# Patient Record
Sex: Male | Born: 1965 | Race: Black or African American | Hispanic: No | Marital: Single | Smoking: Former smoker
Health system: Southern US, Community
[De-identification: ages and names within clinical notes are randomized; demographics above are authoritative.]

## PROBLEM LIST (undated history)

## (undated) ENCOUNTER — Emergency Department (HOSPITAL_COMMUNITY): Payer: 59

## (undated) DIAGNOSIS — E119 Type 2 diabetes mellitus without complications: Secondary | ICD-10-CM

## (undated) DIAGNOSIS — E785 Hyperlipidemia, unspecified: Secondary | ICD-10-CM

## (undated) DIAGNOSIS — L02215 Cutaneous abscess of perineum: Secondary | ICD-10-CM

## (undated) DIAGNOSIS — Z720 Tobacco use: Secondary | ICD-10-CM

## (undated) DIAGNOSIS — E079 Disorder of thyroid, unspecified: Secondary | ICD-10-CM

## (undated) DIAGNOSIS — M1990 Unspecified osteoarthritis, unspecified site: Secondary | ICD-10-CM

## (undated) HISTORY — DX: Hyperlipidemia, unspecified: E78.5

## (undated) HISTORY — DX: Cutaneous abscess of perineum: L02.215

## (undated) HISTORY — DX: Unspecified osteoarthritis, unspecified site: M19.90

---

## 1999-07-02 ENCOUNTER — Emergency Department (HOSPITAL_COMMUNITY): Admission: EM | Admit: 1999-07-02 | Discharge: 1999-07-02 | Payer: Self-pay | Admitting: Emergency Medicine

## 1999-07-06 ENCOUNTER — Emergency Department (HOSPITAL_COMMUNITY): Admission: EM | Admit: 1999-07-06 | Discharge: 1999-07-06 | Payer: Self-pay

## 1999-09-02 ENCOUNTER — Encounter: Payer: Self-pay | Admitting: Emergency Medicine

## 1999-09-02 ENCOUNTER — Emergency Department (HOSPITAL_COMMUNITY): Admission: EM | Admit: 1999-09-02 | Discharge: 1999-09-02 | Payer: Self-pay | Admitting: Emergency Medicine

## 2001-03-04 ENCOUNTER — Emergency Department (HOSPITAL_COMMUNITY): Admission: EM | Admit: 2001-03-04 | Discharge: 2001-03-05 | Payer: Self-pay | Admitting: Emergency Medicine

## 2001-07-30 ENCOUNTER — Encounter: Payer: Self-pay | Admitting: Emergency Medicine

## 2001-07-30 ENCOUNTER — Emergency Department (HOSPITAL_COMMUNITY): Admission: EM | Admit: 2001-07-30 | Discharge: 2001-07-30 | Payer: Self-pay | Admitting: Emergency Medicine

## 2005-12-01 ENCOUNTER — Emergency Department (HOSPITAL_COMMUNITY): Admission: EM | Admit: 2005-12-01 | Discharge: 2005-12-01 | Payer: Self-pay | Admitting: Emergency Medicine

## 2012-07-03 ENCOUNTER — Emergency Department (HOSPITAL_COMMUNITY): Payer: Self-pay

## 2012-07-03 ENCOUNTER — Emergency Department (HOSPITAL_COMMUNITY)
Admission: EM | Admit: 2012-07-03 | Discharge: 2012-07-03 | Disposition: A | Discharge status: Home / Self Care | Payer: Self-pay | Attending: Emergency Medicine | Admitting: Emergency Medicine

## 2012-07-03 ENCOUNTER — Encounter (HOSPITAL_COMMUNITY): Payer: Self-pay | Admitting: Emergency Medicine

## 2012-07-03 DIAGNOSIS — R51 Headache: Secondary | ICD-10-CM | POA: Insufficient documentation

## 2012-07-03 DIAGNOSIS — R630 Anorexia: Secondary | ICD-10-CM | POA: Insufficient documentation

## 2012-07-03 DIAGNOSIS — J3489 Other specified disorders of nose and nasal sinuses: Secondary | ICD-10-CM | POA: Insufficient documentation

## 2012-07-03 DIAGNOSIS — B9789 Other viral agents as the cause of diseases classified elsewhere: Secondary | ICD-10-CM | POA: Insufficient documentation

## 2012-07-03 DIAGNOSIS — R52 Pain, unspecified: Secondary | ICD-10-CM | POA: Insufficient documentation

## 2012-07-03 DIAGNOSIS — R509 Fever, unspecified: Secondary | ICD-10-CM | POA: Insufficient documentation

## 2012-07-03 DIAGNOSIS — B349 Viral infection, unspecified: Secondary | ICD-10-CM

## 2012-07-03 DIAGNOSIS — R0602 Shortness of breath: Secondary | ICD-10-CM | POA: Insufficient documentation

## 2012-07-03 DIAGNOSIS — R6883 Chills (without fever): Secondary | ICD-10-CM | POA: Insufficient documentation

## 2012-07-03 DIAGNOSIS — R197 Diarrhea, unspecified: Secondary | ICD-10-CM | POA: Insufficient documentation

## 2012-07-03 DIAGNOSIS — R42 Dizziness and giddiness: Secondary | ICD-10-CM | POA: Insufficient documentation

## 2012-07-03 DIAGNOSIS — J00 Acute nasopharyngitis [common cold]: Secondary | ICD-10-CM | POA: Insufficient documentation

## 2012-07-03 DIAGNOSIS — R0989 Other specified symptoms and signs involving the circulatory and respiratory systems: Secondary | ICD-10-CM | POA: Insufficient documentation

## 2012-07-03 DIAGNOSIS — F17200 Nicotine dependence, unspecified, uncomplicated: Secondary | ICD-10-CM | POA: Insufficient documentation

## 2012-07-03 MED ORDER — BENZONATATE 100 MG PO CAPS: 100.0000 mg | ORAL_CAPSULE | Freq: Three times a day (TID) | ORAL | Status: DC

## 2012-07-03 MED ORDER — KETOROLAC TROMETHAMINE 30 MG/ML IJ SOLN
30.0000 mg | Freq: Once | INTRAMUSCULAR | Status: AC
  Administered 2012-07-03: 30 mg via INTRAVENOUS
  Filled 2012-07-03: qty 1

## 2012-07-03 MED ORDER — SODIUM CHLORIDE 0.9 % IV BOLUS (SEPSIS)
1000.0000 mL | Freq: Once | INTRAVENOUS | Status: AC
  Administered 2012-07-03: 1000 mL via INTRAVENOUS

## 2012-07-03 MED ORDER — PROMETHAZINE HCL 25 MG PO TABS: 25.0000 mg | ORAL_TABLET | Freq: Four times a day (QID) | ORAL | Status: DC | PRN

## 2012-07-03 MED ORDER — ONDANSETRON HCL 4 MG/2ML IJ SOLN
4.0000 mg | Freq: Once | INTRAMUSCULAR | Status: AC
  Administered 2012-07-03: 4 mg via INTRAVENOUS
  Filled 2012-07-03: qty 2

## 2012-07-03 NOTE — ED Provider Notes (Signed)
History     CSN: [id number]  Arrival date & time 07/03/12  1344   First MD Initiated Contact with Patient 07/03/12 1504      Chief Complaint  Patient presents with  . Emesis  . Generalized Body Aches    (Consider location/radiation/quality/duration/timing/severity/associated sxs/prior treatment) HPI Comments: Patient presents with a 4 to five-day history of vomiting, diarrhea and cold symptoms. He has some runny nose and chest congestion. He has a cough productive of yellow sputum. He had some subjective fevers at home. He feels achy all over. He's been taking over-the-counter medicines without relief. He states his symptoms have been worsening over last few days. He complains of some intermittent shortness of breath with exertion.  Patient is a 47 y.o. male presenting with vomiting.  Emesis  Associated symptoms include chills, cough, diarrhea, a fever, headaches and myalgias. Pertinent negatives include no abdominal pain and no arthralgias.    History reviewed. No pertinent past medical history.  History reviewed. No pertinent past surgical history.  No family history on file.  History  Substance Use Topics  . Smoking status: Current Every Day Smoker -- 1.0 packs/day    Types: Cigarettes  . Smokeless tobacco: Not on file  . Alcohol Use: Yes     Comment: occassional      Review of Systems  Constitutional: Positive for fever, chills, appetite change and fatigue. Negative for diaphoresis.  HENT: Positive for congestion and rhinorrhea.   Eyes: Negative.   Respiratory: Positive for cough and shortness of breath. Negative for chest tightness.   Cardiovascular: Negative for chest pain and leg swelling.  Gastrointestinal: Positive for nausea, vomiting and diarrhea. Negative for abdominal pain and blood in stool.  Genitourinary: Negative for frequency, hematuria, flank pain and difficulty urinating.  Musculoskeletal: Positive for myalgias. Negative for back pain and  arthralgias.  Skin: Negative for rash.  Neurological: Positive for light-headedness and headaches. Negative for dizziness, speech difficulty, weakness and numbness.    Allergies  Bee venom  Home Medications   Current Outpatient Rx  Name  Route  Sig  Dispense  Refill  . BENZONATATE 100 MG PO CAPS   Oral   Take 1 capsule (100 mg total) by mouth every 8 (eight) hours.   21 capsule   0   . PROMETHAZINE HCL 25 MG PO TABS   Oral   Take 1 tablet (25 mg total) by mouth every 6 (six) hours as needed for nausea.   30 tablet   0     BP 122/79  Pulse 118  Temp 99.5 F (37.5 C) (Oral)  Resp 19  SpO2 97%  Physical Exam  Constitutional: He is oriented to person, place, and time. He appears well-developed and well-nourished.  HENT:  Head: Normocephalic and atraumatic.  Mouth/Throat: Oropharynx is clear and moist.  Eyes: Pupils are equal, round, and reactive to light.  Neck: Normal range of motion. Neck supple.  Cardiovascular: Normal rate, regular rhythm and normal heart sounds.   Pulmonary/Chest: Effort normal and breath sounds normal. No respiratory distress. He has no wheezes. He has no rales. He exhibits no tenderness.  Abdominal: Soft. Bowel sounds are normal. There is no tenderness. There is no rebound and no guarding.  Musculoskeletal: Normal range of motion. He exhibits no edema.  Lymphadenopathy:    He has no cervical adenopathy.  Neurological: He is alert and oriented to person, place, and time.  Skin: Skin is warm and dry. No rash noted.  Psychiatric: He has a normal  mood and affect.    ED Course  Procedures (including critical care time)  No results found for this or any previous visit. Dg Chest 2 View  07/03/2012  *RADIOLOGY REPORT*  Clinical Data: Cough and fever  CHEST - 2 VIEW  Comparison: 12/21/2005  Findings: Normal heart size.  No pleural effusion identified.  Chronic interstitial coarsening is noted bilaterally.  There is chronic appearing airway thickening  as well.  No focal airspace consolidation.  The visualized osseous structures appear unremarkable.  IMPRESSION:  1.  Chronic bronchitic changes. 2.  No pneumonia.   Original Report Authenticated By: Signa Kell, M.D.      1. Viral syndrome       MDM  Patient was given IV fluids and Zofran. As well as Toradol. He states he is feeling much better after that. He's had no further episodes of vomiting. He has no evidence of pneumonia. He was discharged home in good condition was given prescriptions for Phenergan and cough medicine. Was advised to follow with primary care physician or return here if his symptoms worsen or are not improving.        Rolan Bucco, MD 07/03/12 1736

## 2012-07-03 NOTE — ED Notes (Signed)
States that he has been having vomiting diarrhea and body aches for the past week. Unable to eat.

## 2012-07-03 NOTE — Progress Notes (Signed)
Pt listed with no insurance coverage CM and [facility] liaison spoke with pt.  Pt offered services to assist with finding a [place] self pay provider & health reform information 

## 2012-07-03 NOTE — ED Notes (Signed)
Pt presents to ed with c/o generalized body pain, fever, cough n/v for a week now. Pt sts received flu shot this year; pt sts nobody else at home with similar symptoms.

## 2014-06-29 DIAGNOSIS — E0500 Thyrotoxicosis with diffuse goiter without thyrotoxic crisis or storm: Secondary | ICD-10-CM

## 2014-06-29 HISTORY — DX: Thyrotoxicosis with diffuse goiter without thyrotoxic crisis or storm: E05.00

## 2017-01-28 ENCOUNTER — Emergency Department (HOSPITAL_COMMUNITY): Payer: Self-pay

## 2017-01-28 ENCOUNTER — Encounter (HOSPITAL_COMMUNITY): Payer: Self-pay | Admitting: Emergency Medicine

## 2017-01-28 ENCOUNTER — Emergency Department (HOSPITAL_COMMUNITY)
Admission: EM | Admit: 2017-01-28 | Discharge: 2017-01-28 | Disposition: A | Discharge status: Home / Self Care | Payer: Self-pay | Attending: Emergency Medicine | Admitting: Emergency Medicine

## 2017-01-28 DIAGNOSIS — R1030 Lower abdominal pain, unspecified: Secondary | ICD-10-CM

## 2017-01-28 DIAGNOSIS — R5383 Other fatigue: Secondary | ICD-10-CM | POA: Insufficient documentation

## 2017-01-28 DIAGNOSIS — E119 Type 2 diabetes mellitus without complications: Secondary | ICD-10-CM | POA: Insufficient documentation

## 2017-01-28 DIAGNOSIS — F17210 Nicotine dependence, cigarettes, uncomplicated: Secondary | ICD-10-CM | POA: Insufficient documentation

## 2017-01-28 DIAGNOSIS — R197 Diarrhea, unspecified: Secondary | ICD-10-CM | POA: Insufficient documentation

## 2017-01-28 DIAGNOSIS — Z79899 Other long term (current) drug therapy: Secondary | ICD-10-CM | POA: Insufficient documentation

## 2017-01-28 DIAGNOSIS — R1031 Right lower quadrant pain: Secondary | ICD-10-CM | POA: Insufficient documentation

## 2017-01-28 DIAGNOSIS — R1032 Left lower quadrant pain: Secondary | ICD-10-CM | POA: Insufficient documentation

## 2017-01-28 DIAGNOSIS — R42 Dizziness and giddiness: Secondary | ICD-10-CM | POA: Insufficient documentation

## 2017-01-28 HISTORY — DX: Disorder of thyroid, unspecified: E07.9

## 2017-01-28 HISTORY — DX: Type 2 diabetes mellitus without complications: E11.9

## 2017-01-28 LAB — CBC
HCT: 44.9 % (ref 39.0–52.0)
Hemoglobin: 15.3 g/dL (ref 13.0–17.0)
MCH: 27.9 pg (ref 26.0–34.0)
MCHC: 34.1 g/dL (ref 30.0–36.0)
MCV: 81.8 fL (ref 78.0–100.0)
PLATELETS: 243 10*3/uL (ref 150–400)
RBC: 5.49 MIL/uL (ref 4.22–5.81)
RDW: 12.4 % (ref 11.5–15.5)
WBC: 6.6 10*3/uL (ref 4.0–10.5)

## 2017-01-28 LAB — URINALYSIS, ROUTINE W REFLEX MICROSCOPIC
Bacteria, UA: NONE SEEN
Bilirubin Urine: NEGATIVE
HGB URINE DIPSTICK: NEGATIVE
KETONES UR: NEGATIVE mg/dL
LEUKOCYTES UA: NEGATIVE
Nitrite: NEGATIVE
PH: 6 (ref 5.0–8.0)
PROTEIN: NEGATIVE mg/dL
Specific Gravity, Urine: 1.021 (ref 1.005–1.030)

## 2017-01-28 LAB — LIPASE, BLOOD: LIPASE: 37 U/L (ref 11–51)

## 2017-01-28 LAB — COMPREHENSIVE METABOLIC PANEL
ALK PHOS: 51 U/L (ref 38–126)
ALT: 20 U/L (ref 17–63)
AST: 21 U/L (ref 15–41)
Albumin: 3.9 g/dL (ref 3.5–5.0)
Anion gap: 7 (ref 5–15)
BILIRUBIN TOTAL: 0.3 mg/dL (ref 0.3–1.2)
BUN: 11 mg/dL (ref 6–20)
CALCIUM: 8.8 mg/dL — AB (ref 8.9–10.3)
CO2: 26 mmol/L (ref 22–32)
CREATININE: 1.13 mg/dL (ref 0.61–1.24)
Chloride: 105 mmol/L (ref 101–111)
GFR calc Af Amer: >60 mL/min (ref >60)
Glucose, Bld: 235 mg/dL — ABNORMAL HIGH (ref 65–99)
Potassium: 4.1 mmol/L (ref 3.5–5.1)
Sodium: 138 mmol/L (ref 135–145)
TOTAL PROTEIN: 7.2 g/dL (ref 6.5–8.1)

## 2017-01-28 LAB — TROPONIN I: Troponin I: <0.03 ng/mL (ref <0.03)

## 2017-01-28 MED ORDER — IOPAMIDOL (ISOVUE-300) INJECTION 61%
INTRAVENOUS | Status: AC
  Filled 2017-01-28: qty 100

## 2017-01-28 MED ORDER — IOPAMIDOL (ISOVUE-300) INJECTION 61%
100.0000 mL | Freq: Once | INTRAVENOUS | Status: AC | PRN
  Administered 2017-01-28: 100 mL via INTRAVENOUS

## 2017-01-28 MED ORDER — SODIUM CHLORIDE 0.9 % IV BOLUS (SEPSIS)
1000.0000 mL | Freq: Once | INTRAVENOUS | Status: AC
  Administered 2017-01-28: 1000 mL via INTRAVENOUS

## 2017-01-28 NOTE — ED Triage Notes (Signed)
Patient c/o abd pain and diarrhea that started yesterday. Patient also reports lightheadedness that started today.

## 2017-01-28 NOTE — ED Notes (Signed)
Patient transported to CT 

## 2017-01-28 NOTE — ED Provider Notes (Signed)
[facility]-EMERGENCY DEPT Provider Note   CSN: [id number] Arrival date & time: 01/28/17  0845     History   Chief Complaint Chief Complaint  Patient presents with  . Abdominal Pain  . Diarrhea  . Dizziness    HPI Kevin Oneill is a 51 y.o. male.  HPI  51 year old male with a history of diabetes presents with abdominal pain. He states his bilateral lower abdomen has been hurting since this morning. He had 4 episodes of loose watery stools yesterday without blood. However no diarrhea today. No nausea and vomiting either day. Had no abdominal pain yesterday but then has been having abdominal pain consistently today. It feels sore and occasionally crampy. He denies any back pain, chest pain. He feels overall fatigued and when he first woke up he was lightheaded. He did not eat today but has been drinking fluids. No fevers. No urinary symptoms. Pain is currently moderate. He declines pain medicine at this time.  Past Medical History:  Diagnosis Date  . Diabetes mellitus without complication (HCC)   . Thyroid disease     There are no active problems to display for this patient.   History reviewed. No pertinent surgical history.     Home Medications    Prior to Admission medications   Medication Sig Start Date End Date Taking? Authorizing Provider  levothyroxine (SYNTHROID, LEVOTHROID) 112 MCG tablet Take 112 mcg by mouth daily before breakfast.   Yes [provider]  metFORMIN (GLUCOPHAGE) 1000 MG tablet Take 1,000 mg by mouth 2 (two) times daily with a meal.   Yes [provider]  benzonatate (TESSALON) 100 MG capsule Take 1 capsule (100 mg total) by mouth every 8 (eight) hours. Patient not taking: Reported on 01/28/2017 07/03/12   Rolan Bucco, MD  promethazine (PHENERGAN) 25 MG tablet Take 1 tablet (25 mg total) by mouth every 6 (six) hours as needed for nausea. Patient not taking: Reported on 01/28/2017 07/03/12   Rolan Bucco, MD    Family History No  family history on file.  Social History Social History  Substance Use Topics  . Smoking status: Current Every Day Smoker    Packs/day: 1.00    Types: Cigarettes  . Smokeless tobacco: Never Used  . Alcohol use Yes     Comment: occassional     Allergies   Bee venom   Review of Systems Review of Systems  Constitutional: Positive for fatigue. Negative for fever.  Cardiovascular: Negative for chest pain.  Gastrointestinal: Positive for abdominal pain and diarrhea. Negative for blood in stool, nausea and vomiting.  Musculoskeletal: Negative for back pain.  Neurological: Positive for light-headedness.  All other systems reviewed and are negative.    Physical Exam Updated Vital Signs BP 116/77 (BP Location: Left Arm)   Pulse 76   Temp 98.3 F (36.8 C) (Oral)   Resp 15   Ht 5\' 9"  (1.753 m)   Wt 99.8 kg (220 lb)   SpO2 100%   BMI 32.49 kg/m   Physical Exam  Constitutional: He is oriented to person, place, and time. He appears well-developed and well-nourished.  HENT:  Head: Normocephalic and atraumatic.  Right Ear: External ear normal.  Left Ear: External ear normal.  Nose: Nose normal.  Eyes: Right eye exhibits no discharge. Left eye exhibits no discharge.  Neck: Neck supple.  Cardiovascular: Normal rate, regular rhythm and normal heart sounds.   Pulmonary/Chest: Effort normal and breath sounds normal.  Abdominal: Soft. There is tenderness in the right lower  quadrant and left lower quadrant.  LLQ worse than RLQ  Musculoskeletal: He exhibits no edema.  Neurological: He is alert and oriented to person, place, and time.  Skin: Skin is warm and dry.  Nursing note and vitals reviewed.    ED Treatments / Results  Labs (all labs ordered are listed, but only abnormal results are displayed) Labs Reviewed  COMPREHENSIVE METABOLIC PANEL - Abnormal; Notable for the following:       Result Value   Glucose, Bld 235 (*)    Calcium 8.8 (*)    All other components within  normal limits  URINALYSIS, ROUTINE W REFLEX MICROSCOPIC - Abnormal; Notable for the following:    Glucose, UA >=500 (*)    Squamous Epithelial / LPF 0-5 (*)    All other components within normal limits  LIPASE, BLOOD  CBC  TROPONIN I    EKG  EKG Interpretation  Date/Time:  Monday January 28 2017 14:01:24 EDT Ventricular Rate:  83 PR Interval:    QRS Duration: 79 QT Interval:  344 QTC Calculation: 405 R Axis:   73 Text Interpretation:  Sinus rhythm no significant change compared to earlier in the day Confirmed by Pricilla LovelessGoldston, Nikki Glanzer [phone]([id number]) on 01/28/2017 2:03:38 PM Also confirmed by Pricilla LovelessGoldston, Swara Donze [phone]), editor Madalyn RobEverhart, Marilyn [phone])  on 01/28/2017 2:05:00 PM       Radiology Ct Abdomen Pelvis W Contrast  Result Date: 01/28/2017 CLINICAL DATA:  51 year old male with abdominal pain and diarrhea since yesterday. EXAM: CT ABDOMEN AND PELVIS WITH CONTRAST TECHNIQUE: Multidetector CT imaging of the abdomen and pelvis was performed using the standard protocol following bolus administration of intravenous contrast. CONTRAST:  100mL ISOVUE-300 IOPAMIDOL (ISOVUE-300) INJECTION 61% COMPARISON:  Chest radiographs 07/03/2012 FINDINGS: Lower chest: Minor lung base atelectasis. Otherwise negative. No pericardial or pleural effusion. Hepatobiliary: Negative liver and gallbladder. No biliary ductal enlargement. Pancreas: Negative. Spleen: Negative. Adrenals/Urinary Tract: Normal adrenal glands. Bilateral renal enhancement and contrast excretion is normal. No nephrolithiasis. No perinephric stranding. Negative ureters. Unremarkable urinary bladder. Stomach/Bowel: Negative rectum. Mildly redundant sigmoid colon. Retained stool throughout the left colon, transverse, and right colon. But no areas of large bowel inflammation. Normal appendix (coronal image 58) located adjacent to the distal ileum. Negative terminal ileum. No dilated or inflamed small bowel. Negative stomach and duodenum. No abdominal free fluid.  Vascular/Lymphatic: Major arterial structures are patent. Minimal calcified plaque. Portal venous system appears patent. No lymphadenopathy. Reproductive: Negative. Other: No pelvic free fluid. Musculoskeletal: No acute osseous abnormality identified. IMPRESSION: No acute or inflammatory process identified in the abdomen or pelvis. Normal appendix. Electronically Signed   By: Odessa FlemingH  Hall M.D.   On: 01/28/2017 13:01    Procedures Procedures (including critical care time)  Medications Ordered in ED Medications  iopamidol (ISOVUE-300) 61 % injection (not administered)  sodium chloride 0.9 % bolus 1,000 mL (0 mLs Intravenous Stopped 01/28/17 1306)  iopamidol (ISOVUE-300) 61 % injection 100 mL (100 mLs Intravenous Contrast Given 01/28/17 1233)     Initial Impression / Assessment and Plan / ED Course  I have reviewed the triage vital signs and the nursing notes.  Pertinent labs & imaging results that were available during my care of the patient were reviewed by me and considered in my medical decision making (see chart for details).     No clear etiology for his abdominal pain on CT scan. Possibly this is related to the recent diarrhea although he does not currently have diarrhea. Vital signs are unremarkable. He feels better after IV  fluids. His labs are unremarkable except for moderate hyperglycemia. ECG appears mildly changed compared to several years ago but no acute ischemic changes. I doubt this is ACS. Tylenol, NSAIDs as needed, plenty of fluids. Follow-up with PCP or return if symptoms are worsening/not improving.  Final Clinical Impressions(s) / ED Diagnoses   Final diagnoses:  Lower abdominal pain    New Prescriptions Discharge Medication List as of 01/28/2017  2:04 PM       Pricilla LovelessGoldston, Frank Novelo, MD 01/28/17 1536

## 2018-11-16 ENCOUNTER — Encounter (HOSPITAL_COMMUNITY): Payer: Self-pay | Admitting: Emergency Medicine

## 2018-11-16 ENCOUNTER — Other Ambulatory Visit: Payer: Self-pay

## 2018-11-16 DIAGNOSIS — Z5321 Procedure and treatment not carried out due to patient leaving prior to being seen by health care provider: Secondary | ICD-10-CM | POA: Insufficient documentation

## 2018-11-16 DIAGNOSIS — R51 Headache: Secondary | ICD-10-CM | POA: Insufficient documentation

## 2018-11-16 NOTE — ED Triage Notes (Addendum)
Patient is complaining of a headache that he has had for about 5 days. Patient states he has a hx of ear infection.

## 2018-11-17 ENCOUNTER — Emergency Department (HOSPITAL_COMMUNITY)
Admission: EM | Admit: 2018-11-17 | Discharge: 2018-11-17 | Disposition: A | Discharge status: Home / Self Care | Payer: Self-pay | Attending: Emergency Medicine | Admitting: Emergency Medicine

## 2018-11-17 NOTE — ED Notes (Signed)
Pt states he is not wanting to wait any longer, Pt is stating he is going to [place], that they can call his Pcp from there.

## 2019-07-13 ENCOUNTER — Encounter (HOSPITAL_COMMUNITY): Payer: Self-pay

## 2019-07-13 ENCOUNTER — Other Ambulatory Visit: Payer: Self-pay

## 2019-07-13 DIAGNOSIS — Y9389 Activity, other specified: Secondary | ICD-10-CM | POA: Insufficient documentation

## 2019-07-13 DIAGNOSIS — E119 Type 2 diabetes mellitus without complications: Secondary | ICD-10-CM | POA: Insufficient documentation

## 2019-07-13 DIAGNOSIS — Y92810 Car as the place of occurrence of the external cause: Secondary | ICD-10-CM | POA: Insufficient documentation

## 2019-07-13 DIAGNOSIS — Y999 Unspecified external cause status: Secondary | ICD-10-CM | POA: Insufficient documentation

## 2019-07-13 DIAGNOSIS — F17210 Nicotine dependence, cigarettes, uncomplicated: Secondary | ICD-10-CM | POA: Insufficient documentation

## 2019-07-13 DIAGNOSIS — W010XXA Fall on same level from slipping, tripping and stumbling without subsequent striking against object, initial encounter: Secondary | ICD-10-CM | POA: Insufficient documentation

## 2019-07-13 DIAGNOSIS — Z79899 Other long term (current) drug therapy: Secondary | ICD-10-CM | POA: Insufficient documentation

## 2019-07-13 DIAGNOSIS — S0012XA Contusion of left eyelid and periocular area, initial encounter: Secondary | ICD-10-CM | POA: Insufficient documentation

## 2019-07-13 MED ORDER — TETRACAINE HCL 0.5 % OP SOLN
2.0000 [drp] | Freq: Once | OPHTHALMIC | Status: AC
  Administered 2019-07-14: 2 [drp] via OPHTHALMIC
  Filled 2019-07-13: qty 4

## 2019-07-13 MED ORDER — FLUORESCEIN SODIUM 1 MG OP STRP
1.0000 | ORAL_STRIP | Freq: Once | OPHTHALMIC | Status: AC
  Administered 2019-07-14: 1 via OPHTHALMIC
  Filled 2019-07-13: qty 1

## 2019-07-13 NOTE — ED Triage Notes (Signed)
Patient arrived stating that he fell on Saturday getting out of the car and hit his left eye. Reports his eye being sore and swelling is noted around the eye and redness in the eye. Patient declines any change in vision. No LOC.

## 2019-07-14 ENCOUNTER — Emergency Department (HOSPITAL_COMMUNITY)
Admission: EM | Admit: 2019-07-14 | Discharge: 2019-07-14 | Disposition: A | Discharge status: Home / Self Care | Payer: Self-pay | Attending: Emergency Medicine | Admitting: Emergency Medicine

## 2019-07-14 ENCOUNTER — Emergency Department (HOSPITAL_COMMUNITY): Payer: Self-pay

## 2019-07-14 DIAGNOSIS — S0083XA Contusion of other part of head, initial encounter: Secondary | ICD-10-CM

## 2019-07-14 MED ORDER — OFLOXACIN 0.3 % OP SOLN
1.0000 [drp] | Freq: Four times a day (QID) | OPHTHALMIC | Status: DC
  Administered 2019-07-14: 07:00:00 1 [drp] via OPHTHALMIC
  Filled 2019-07-14: qty 5
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
## 2020-02-26 DIAGNOSIS — T148XXA Other injury of unspecified body region, initial encounter: Secondary | ICD-10-CM

## 2020-02-26 HISTORY — DX: Other injury of unspecified body region, initial encounter: T14.8XXA

## 2020-02-27 ENCOUNTER — Other Ambulatory Visit: Payer: Self-pay

## 2020-02-27 ENCOUNTER — Encounter (HOSPITAL_COMMUNITY): Payer: Self-pay | Admitting: Emergency Medicine

## 2020-02-27 ENCOUNTER — Emergency Department (HOSPITAL_COMMUNITY): Payer: Self-pay

## 2020-02-27 ENCOUNTER — Emergency Department (HOSPITAL_COMMUNITY)
Admission: EM | Admit: 2020-02-27 | Discharge: 2020-02-27 | Disposition: A | Discharge status: Home / Self Care | Payer: Self-pay | Attending: Emergency Medicine | Admitting: Emergency Medicine

## 2020-02-27 DIAGNOSIS — F17210 Nicotine dependence, cigarettes, uncomplicated: Secondary | ICD-10-CM | POA: Insufficient documentation

## 2020-02-27 DIAGNOSIS — Z7984 Long term (current) use of oral hypoglycemic drugs: Secondary | ICD-10-CM | POA: Insufficient documentation

## 2020-02-27 DIAGNOSIS — Y92410 Unspecified street and highway as the place of occurrence of the external cause: Secondary | ICD-10-CM | POA: Insufficient documentation

## 2020-02-27 DIAGNOSIS — M25561 Pain in right knee: Secondary | ICD-10-CM | POA: Insufficient documentation

## 2020-02-27 DIAGNOSIS — E119 Type 2 diabetes mellitus without complications: Secondary | ICD-10-CM | POA: Insufficient documentation

## 2020-02-27 DIAGNOSIS — Y939 Activity, unspecified: Secondary | ICD-10-CM | POA: Insufficient documentation

## 2020-02-27 DIAGNOSIS — Y999 Unspecified external cause status: Secondary | ICD-10-CM | POA: Insufficient documentation

## 2020-02-27 MED ORDER — HYDROCODONE-ACETAMINOPHEN 5-325 MG PO TABS
2.0000 | ORAL_TABLET | Freq: Once | ORAL | Status: AC
  Administered 2020-02-27: 2 via ORAL
  Filled 2020-02-27: qty 2

## 2020-02-27 MED ORDER — CYCLOBENZAPRINE HCL 10 MG PO TABS: 10.0000 mg | ORAL_TABLET | Freq: Two times a day (BID) | ORAL | 0 refills | Status: DC | PRN

## 2020-02-27 MED ORDER — IBUPROFEN 800 MG PO TABS: 800.0000 mg | ORAL_TABLET | Freq: Three times a day (TID) | ORAL | 0 refills | Status: DC
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
## 2020-03-01 DIAGNOSIS — M25561 Pain in right knee: Secondary | ICD-10-CM | POA: Insufficient documentation

## 2020-03-02 ENCOUNTER — Other Ambulatory Visit: Payer: Self-pay | Admitting: Orthopedic Surgery

## 2020-03-02 DIAGNOSIS — M25561 Pain in right knee: Secondary | ICD-10-CM

## 2020-03-20 ENCOUNTER — Encounter (HOSPITAL_COMMUNITY): Payer: Self-pay

## 2020-03-20 ENCOUNTER — Other Ambulatory Visit: Payer: Self-pay

## 2020-03-20 ENCOUNTER — Emergency Department (HOSPITAL_COMMUNITY): Payer: Self-pay

## 2020-03-20 ENCOUNTER — Inpatient Hospital Stay (HOSPITAL_COMMUNITY)
Admission: EM | Admit: 2020-03-20 | Discharge: 2020-03-23 | DRG: 571 | Disposition: A | Discharge status: Home Health | Payer: Self-pay | Attending: Internal Medicine | Admitting: Internal Medicine

## 2020-03-20 DIAGNOSIS — Z8249 Family history of ischemic heart disease and other diseases of the circulatory system: Secondary | ICD-10-CM

## 2020-03-20 DIAGNOSIS — E039 Hypothyroidism, unspecified: Secondary | ICD-10-CM

## 2020-03-20 DIAGNOSIS — E876 Hypokalemia: Secondary | ICD-10-CM

## 2020-03-20 DIAGNOSIS — Z20822 Contact with and (suspected) exposure to covid-19: Secondary | ICD-10-CM | POA: Diagnosis present

## 2020-03-20 DIAGNOSIS — R739 Hyperglycemia, unspecified: Secondary | ICD-10-CM

## 2020-03-20 DIAGNOSIS — E871 Hypo-osmolality and hyponatremia: Secondary | ICD-10-CM | POA: Diagnosis present

## 2020-03-20 DIAGNOSIS — I10 Essential (primary) hypertension: Secondary | ICD-10-CM | POA: Diagnosis present

## 2020-03-20 DIAGNOSIS — R072 Precordial pain: Secondary | ICD-10-CM

## 2020-03-20 DIAGNOSIS — T383X6A Underdosing of insulin and oral hypoglycemic [antidiabetic] drugs, initial encounter: Secondary | ICD-10-CM | POA: Diagnosis present

## 2020-03-20 DIAGNOSIS — Z87891 Personal history of nicotine dependence: Secondary | ICD-10-CM

## 2020-03-20 DIAGNOSIS — L02215 Cutaneous abscess of perineum: Secondary | ICD-10-CM

## 2020-03-20 DIAGNOSIS — R079 Chest pain, unspecified: Secondary | ICD-10-CM

## 2020-03-20 DIAGNOSIS — L03314 Cellulitis of groin: Secondary | ICD-10-CM | POA: Diagnosis present

## 2020-03-20 DIAGNOSIS — R3911 Hesitancy of micturition: Secondary | ICD-10-CM | POA: Diagnosis present

## 2020-03-20 DIAGNOSIS — E1165 Type 2 diabetes mellitus with hyperglycemia: Secondary | ICD-10-CM | POA: Diagnosis present

## 2020-03-20 DIAGNOSIS — R0789 Other chest pain: Secondary | ICD-10-CM | POA: Diagnosis present

## 2020-03-20 DIAGNOSIS — Z794 Long term (current) use of insulin: Secondary | ICD-10-CM

## 2020-03-20 DIAGNOSIS — E785 Hyperlipidemia, unspecified: Secondary | ICD-10-CM | POA: Diagnosis present

## 2020-03-20 DIAGNOSIS — Z833 Family history of diabetes mellitus: Secondary | ICD-10-CM

## 2020-03-20 DIAGNOSIS — L02214 Cutaneous abscess of groin: Principal | ICD-10-CM

## 2020-03-20 DIAGNOSIS — E1169 Type 2 diabetes mellitus with other specified complication: Secondary | ICD-10-CM

## 2020-03-20 DIAGNOSIS — Z91120 Patient's intentional underdosing of medication regimen due to financial hardship: Secondary | ICD-10-CM

## 2020-03-20 DIAGNOSIS — F17210 Nicotine dependence, cigarettes, uncomplicated: Secondary | ICD-10-CM | POA: Diagnosis present

## 2020-03-20 HISTORY — DX: Tobacco use: Z72.0

## 2020-03-20 LAB — URINALYSIS, ROUTINE W REFLEX MICROSCOPIC
Bacteria, UA: NONE SEEN
Bilirubin Urine: NEGATIVE
Glucose, UA: >=500 mg/dL — AB
Ketones, ur: 20 mg/dL — AB
Leukocytes,Ua: NEGATIVE
Nitrite: NEGATIVE
Protein, ur: 100 mg/dL — AB
Specific Gravity, Urine: 1.035 — ABNORMAL HIGH (ref 1.005–1.030)
pH: 5 (ref 5.0–8.0)

## 2020-03-20 LAB — CBG MONITORING, ED: Glucose-Capillary: 349 mg/dL — ABNORMAL HIGH (ref 70–99)

## 2020-03-20 LAB — COMPREHENSIVE METABOLIC PANEL
ALT: 34 U/L (ref 0–44)
AST: 23 U/L (ref 15–41)
Albumin: 3.6 g/dL (ref 3.5–5.0)
Alkaline Phosphatase: 96 U/L (ref 38–126)
Anion gap: 12 (ref 5–15)
BUN: 14 mg/dL (ref 6–20)
CO2: 25 mmol/L (ref 22–32)
Calcium: 8.7 mg/dL — ABNORMAL LOW (ref 8.9–10.3)
Chloride: 97 mmol/L — ABNORMAL LOW (ref 98–111)
Creatinine, Ser: 1.24 mg/dL (ref 0.61–1.24)
GFR calc Af Amer: >60 mL/min (ref >60)
GFR calc non Af Amer: >60 mL/min (ref >60)
Glucose, Bld: 392 mg/dL — ABNORMAL HIGH (ref 70–99)
Potassium: 3.5 mmol/L (ref 3.5–5.1)
Sodium: 134 mmol/L — ABNORMAL LOW (ref 135–145)
Total Bilirubin: 1.2 mg/dL (ref 0.3–1.2)
Total Protein: 8 g/dL (ref 6.5–8.1)

## 2020-03-20 LAB — CBC
HCT: 41.4 % (ref 39.0–52.0)
Hemoglobin: 13.9 g/dL (ref 13.0–17.0)
MCH: 28.9 pg (ref 26.0–34.0)
MCHC: 33.6 g/dL (ref 30.0–36.0)
MCV: 86.1 fL (ref 80.0–100.0)
Platelets: 257 10*3/uL (ref 150–400)
RBC: 4.81 MIL/uL (ref 4.22–5.81)
RDW: 11.3 % — ABNORMAL LOW (ref 11.5–15.5)
WBC: 12.9 10*3/uL — ABNORMAL HIGH (ref 4.0–10.5)
nRBC: 0 % (ref 0.0–0.2)

## 2020-03-20 LAB — TROPONIN I (HIGH SENSITIVITY): Troponin I (High Sensitivity): 3 ng/L (ref <18)

## 2020-03-20 LAB — LACTIC ACID, PLASMA: Lactic Acid, Venous: 1.8 mmol/L (ref 0.5–1.9)

## 2020-03-20 LAB — SARS CORONAVIRUS 2 BY RT PCR (HOSPITAL ORDER, PERFORMED IN ~~LOC~~ HOSPITAL LAB): SARS Coronavirus 2: NEGATIVE

## 2020-03-20 MED ORDER — IOHEXOL 350 MG/ML SOLN
100.0000 mL | Freq: Once | INTRAVENOUS | Status: AC | PRN
  Administered 2020-03-20: 100 mL via INTRAVENOUS

## 2020-03-20 MED ORDER — CLINDAMYCIN HCL 300 MG PO CAPS: 300.0000 mg | ORAL_CAPSULE | Freq: Three times a day (TID) | ORAL | 0 refills | Status: DC

## 2020-03-20 MED ORDER — MORPHINE SULFATE (PF) 4 MG/ML IV SOLN
4.0000 mg | Freq: Once | INTRAVENOUS | Status: AC
  Administered 2020-03-20: 4 mg via INTRAVENOUS
  Filled 2020-03-20: qty 1

## 2020-03-20 MED ORDER — INSULIN ASPART 100 UNIT/ML ~~LOC~~ SOLN
5.0000 [IU] | Freq: Once | SUBCUTANEOUS | Status: AC
  Administered 2020-03-20: 5 [IU] via SUBCUTANEOUS
  Filled 2020-03-20: qty 0.05

## 2020-03-20 MED ORDER — METFORMIN HCL 500 MG PO TABS: 500.0000 mg | ORAL_TABLET | Freq: Two times a day (BID) | ORAL | 0 refills | Status: DC

## 2020-03-20 MED ORDER — HYDROCODONE-ACETAMINOPHEN 5-325 MG PO TABS: 1.0000 | ORAL_TABLET | Freq: Four times a day (QID) | ORAL | 0 refills | Status: DC | PRN

## 2020-03-20 MED ORDER — VANCOMYCIN HCL IN DEXTROSE 1-5 GM/200ML-% IV SOLN
1000.0000 mg | Freq: Once | INTRAVENOUS | Status: AC
  Administered 2020-03-20: 1000 mg via INTRAVENOUS
  Filled 2020-03-20: qty 200

## 2020-03-20 MED ORDER — PIPERACILLIN-TAZOBACTAM 3.375 G IVPB 30 MIN
3.3750 g | Freq: Once | INTRAVENOUS | Status: AC
  Administered 2020-03-20: 3.375 g via INTRAVENOUS
  Filled 2020-03-20: qty 50
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
## 2020-03-20 NOTE — ED Triage Notes (Signed)
Patient arrived stating he has swelling in his perineum since Friday. Declines any drainage or injury. Also states he has had central chest pain and shortness of breath.

## 2020-03-20 NOTE — Discharge Instructions (Addendum)
Take clindamycin as prescribed   Take metformin twice daily to control your diabetes   Take vicodin for severe pain   See wellness center for follow up. You need to see primary care doctor to manage your diabetes   Return to ER if you have worse rectal pain and swelling, fever, trouble breathing

## 2020-03-20 NOTE — ED Provider Notes (Signed)
Nursing notes and vitals signs, including pulse oximetry, reviewed.  Summary of this visit's results, reviewed by myself:  EKG:  EKG Interpretation  Date/Time:  Sunday March 20 2020 20:22:32 EDT Ventricular Rate:  114 PR Interval:    QRS Duration: 77 QT Interval:  307 QTC Calculation: 425 R Axis:   64 Text Interpretation: Sinus tachycardia Low voltage, extremity leads ST elev, probable normal early repol pattern No significant change since last tracing Confirmed by Richardean Canal [phone]) on 03/20/2020 8:40:26 PM       Labs:  Results for orders placed or performed during the hospital encounter of 03/20/20 (from the past 24 hour(s))  CBC     Status: Abnormal   Collection Time: 03/20/20  8:34 PM  Result Value Ref Range   WBC 12.9 (H) 4.0 - 10.5 K/uL   RBC 4.81 4.22 - 5.81 MIL/uL   Hemoglobin 13.9 13.0 - 17.0 g/dL   HCT 63.0 39 - 52 %   MCV 86.1 80.0 - 100.0 fL   MCH 28.9 26.0 - 34.0 pg   MCHC 33.6 30.0 - 36.0 g/dL   RDW 16.0 (L) 10.9 - 32.3 %   Platelets 257 150 - 400 K/uL   nRBC 0.0 0.0 - 0.2 %  Troponin I (High Sensitivity)     Status: None   Collection Time: 03/20/20  8:34 PM  Result Value Ref Range   Troponin I (High Sensitivity) 3 <18 ng/L  Lactic acid, plasma     Status: None   Collection Time: 03/20/20  8:34 PM  Result Value Ref Range   Lactic Acid, Venous 1.8 0.5 - 1.9 mmol/L  Comprehensive metabolic panel     Status: Abnormal   Collection Time: 03/20/20  8:34 PM  Result Value Ref Range   Sodium 134 (L) 135 - 145 mmol/L   Potassium 3.5 3.5 - 5.1 mmol/L   Chloride 97 (L) 98 - 111 mmol/L   CO2 25 22 - 32 mmol/L   Glucose, Bld 392 (H) 70 - 99 mg/dL   BUN 14 6 - 20 mg/dL   Creatinine, Ser 5.57 0.61 - 1.24 mg/dL   Calcium 8.7 (L) 8.9 - 10.3 mg/dL   Total Protein 8.0 6.5 - 8.1 g/dL   Albumin 3.6 3.5 - 5.0 g/dL   AST 23 15 - 41 U/L   ALT 34 0 - 44 U/L   Alkaline Phosphatase 96 38 - 126 U/L   Total Bilirubin 1.2 0.3 - 1.2 mg/dL   GFR calc non Af Amer >60 >60  mL/min   GFR calc Af Amer >60 >60 mL/min   Anion gap 12 5 - 15  Urinalysis, Routine w reflex microscopic Urine, Clean Catch     Status: Abnormal   Collection Time: 03/20/20  8:42 PM  Result Value Ref Range   Color, Urine AMBER (A) YELLOW   APPearance CLEAR CLEAR   Specific Gravity, Urine 1.035 (H) 1.005 - 1.030   pH 5.0 5.0 - 8.0   Glucose, UA >=500 (A) NEGATIVE mg/dL   Hgb urine dipstick SMALL (A) NEGATIVE   Bilirubin Urine NEGATIVE NEGATIVE   Ketones, ur 20 (A) NEGATIVE mg/dL   Protein, ur 322 (A) NEGATIVE mg/dL   Nitrite NEGATIVE NEGATIVE   Leukocytes,Ua NEGATIVE NEGATIVE   RBC / HPF 0-5 0 - 5 RBC/hpf   WBC, UA 6-10 0 - 5 WBC/hpf   Bacteria, UA NONE SEEN NONE SEEN   Squamous Epithelial / LPF 0-5 0 - 5   Mucus PRESENT  SARS Coronavirus 2 by RT PCR (hospital order, performed in [facility] hospital lab) Nasopharyngeal Nasopharyngeal Swab     Status: None   Collection Time: 03/20/20  8:54 PM   Specimen: Nasopharyngeal Swab  Result Value Ref Range   SARS Coronavirus 2 NEGATIVE NEGATIVE  CBG monitoring, ED     Status: Abnormal   Collection Time: 03/20/20 10:29 PM  Result Value Ref Range   Glucose-Capillary 349 (H) 70 - 99 mg/dL    Imaging Studies: CT Angio Chest PE W and/or Wo Contrast  Result Date: 03/20/2020 CLINICAL DATA:  Chest pain and shortness of breath EXAM: CT ANGIOGRAPHY CHEST WITH CONTRAST TECHNIQUE: Multidetector CT imaging of the chest was performed using the standard protocol during bolus administration of intravenous contrast. Multiplanar CT image reconstructions and MIPs were obtained to evaluate the vascular anatomy. CONTRAST:  OMNIPAQUE IOHEXOL 350 MG/ML SOLN COMPARISON:  None. FINDINGS: Cardiovascular: Contrast injection is sufficient to demonstrate satisfactory opacification of the pulmonary arteries to the segmental level. There is no pulmonary embolus or evidence of right heart strain. The size of the main pulmonary artery is normal. Heart size is  normal, with no pericardial effusion. The course and caliber of the aorta are normal. There is no atherosclerotic calcification. Opacification decreased due to pulmonary arterial phase contrast bolus timing. Mediastinum/Nodes: No mediastinal, hilar or axillary lymphadenopathy. Normal visualized thyroid. Thoracic esophageal course is normal. Lungs/Pleura: Airways are patent. Bibasilar atelectasis. No pleural effusion, lobar consolidation, pneumothorax or pulmonary infarction. Upper Abdomen: Contrast bolus timing is not optimized for evaluation of the abdominal organs. The visualized portions of the organs of the upper abdomen are normal. Musculoskeletal: No chest wall abnormality. No bony spinal canal stenosis. Review of the MIP images confirms the above findings. findings IMPRESSION: No pulmonary embolus or other acute thoracic abnormality. Electronically Signed   By: Deatra Robinson M.D.   On: 03/20/2020 23:04   CT ABDOMEN PELVIS W CONTRAST  Result Date: 03/20/2020 CLINICAL DATA:  Perineal swelling EXAM: CT ABDOMEN AND PELVIS WITH CONTRAST TECHNIQUE: Multidetector CT imaging of the abdomen and pelvis was performed using the standard protocol following bolus administration of intravenous contrast. CONTRAST:  OMNIPAQUE IOHEXOL 350 MG/ML SOLN COMPARISON:  01/28/2017 FINDINGS: Hepatobiliary: Normal hepatic contours and density. No visible biliary dilatation. Normal gallbladder. Pancreas: Normal contours without ductal dilatation. No peripancreatic fluid collection. Spleen: Normal. Adrenals/Urinary Tract: --Adrenal glands: Normal. --Right kidney/ureter: No hydronephrosis or perinephric stranding. No nephrolithiasis. No obstructing ureteral stones. --Left kidney/ureter: No hydronephrosis or perinephric stranding. No nephrolithiasis. No obstructing ureteral stones. --Urinary bladder: Unremarkable. Stomach/Bowel: --Stomach/Duodenum: No hiatal hernia or other gastric abnormality. Normal duodenal course and caliber.  --Small bowel: No dilatation or inflammation. --Colon: No focal abnormality. --Appendix: Normal. Vascular/Lymphatic: Normal course and caliber of the major abdominal vessels. No abdominal or pelvic lymphadenopathy. Reproductive: Headache there is a gas and fluid containing right perineal collection that measures 7.2 x 5.1 x 2.0 cm. Musculoskeletal. No bony spinal canal stenosis or focal osseous abnormality. Other: None. IMPRESSION: Right perineal abscess measuring up to 7.2 x 5.1 x 2.0 cm. Electronically Signed   By: Deatra Robinson M.D.   On: 03/20/2020 23:51   DG Chest Port 1 View  Result Date: 03/20/2020 CLINICAL DATA:  Shortness of breath EXAM: PORTABLE CHEST 1 VIEW COMPARISON:  07/03/2012 FINDINGS: The heart size and mediastinal contours are within normal limits. Both lungs are clear. The visualized skeletal structures are unremarkable. IMPRESSION: No active disease. Electronically Signed   By: Eulah Pont.D.  On: 03/20/2020 20:26     12:24 AM Discussed with Dr. Benancio Deeds with urology.  He is reviewed the photograph and CT scans.  He believes this is more a general surgical issue as it does not appear to involve the scrotum at this time.  He is requesting a general surgery consult but will be happy to assist if requested.  12:32 AM Dr. Carolynne Edouard of general surgery consulted.  They will see the patient in the morning.  Hospitalist service will admit.  12:42 AM Hospitalist to admit.   CRITICAL CARE Performed by: Carlisle Beers Graciela Plato Total critical care time: 30 minutes Critical care time was exclusive of separately billable procedures and treating other patients. Critical care was necessary to treat or prevent imminent or life-threatening deterioration. Critical care was time spent personally by me on the following activities: development of treatment plan with patient and/or surrogate as well as nursing, discussions with consultants, evaluation of patient's response to treatment, examination of  patient, obtaining history from patient or surrogate, ordering and performing treatments and interventions, ordering and review of laboratory studies, ordering and review of radiographic studies, pulse oximetry and re-evaluation of patient's condition.      Shalon Salado, Jonny Ruiz, MD 03/21/20 [id number]

## 2020-03-21 ENCOUNTER — Other Ambulatory Visit: Payer: Self-pay

## 2020-03-21 ENCOUNTER — Encounter (HOSPITAL_COMMUNITY)
Admission: EM | Disposition: A | Discharge status: Home Health | Payer: Self-pay | Source: Home / Self Care | Attending: Internal Medicine

## 2020-03-21 ENCOUNTER — Encounter (HOSPITAL_COMMUNITY): Payer: Self-pay | Admitting: Family Medicine

## 2020-03-21 ENCOUNTER — Inpatient Hospital Stay (HOSPITAL_COMMUNITY): Payer: Self-pay

## 2020-03-21 ENCOUNTER — Inpatient Hospital Stay (HOSPITAL_COMMUNITY): Payer: Self-pay | Admitting: Certified Registered Nurse Anesthetist

## 2020-03-21 ENCOUNTER — Other Ambulatory Visit: Payer: Self-pay | Admitting: Medical

## 2020-03-21 DIAGNOSIS — E1169 Type 2 diabetes mellitus with other specified complication: Secondary | ICD-10-CM

## 2020-03-21 DIAGNOSIS — Z720 Tobacco use: Secondary | ICD-10-CM

## 2020-03-21 DIAGNOSIS — L02215 Cutaneous abscess of perineum: Secondary | ICD-10-CM

## 2020-03-21 DIAGNOSIS — L02214 Cutaneous abscess of groin: Secondary | ICD-10-CM

## 2020-03-21 DIAGNOSIS — R079 Chest pain, unspecified: Secondary | ICD-10-CM

## 2020-03-21 DIAGNOSIS — E1165 Type 2 diabetes mellitus with hyperglycemia: Secondary | ICD-10-CM

## 2020-03-21 DIAGNOSIS — Z87891 Personal history of nicotine dependence: Secondary | ICD-10-CM

## 2020-03-21 DIAGNOSIS — E039 Hypothyroidism, unspecified: Secondary | ICD-10-CM

## 2020-03-21 DIAGNOSIS — E876 Hypokalemia: Secondary | ICD-10-CM

## 2020-03-21 DIAGNOSIS — E785 Hyperlipidemia, unspecified: Secondary | ICD-10-CM

## 2020-03-21 DIAGNOSIS — R072 Precordial pain: Secondary | ICD-10-CM

## 2020-03-21 HISTORY — PX: IRRIGATION AND DEBRIDEMENT ABSCESS: SHX5252

## 2020-03-21 HISTORY — DX: Cutaneous abscess of groin: L02.214

## 2020-03-21 LAB — ECHOCARDIOGRAM COMPLETE
Height: 69 in
S' Lateral: 2.95 cm
Weight: 3104 oz

## 2020-03-21 LAB — BASIC METABOLIC PANEL
Anion gap: 15 (ref 5–15)
BUN: 14 mg/dL (ref 6–20)
CO2: 23 mmol/L (ref 22–32)
Calcium: 8.8 mg/dL — ABNORMAL LOW (ref 8.9–10.3)
Chloride: 100 mmol/L (ref 98–111)
Creatinine, Ser: 1.02 mg/dL (ref 0.61–1.24)
GFR calc Af Amer: >60 mL/min (ref >60)
GFR calc non Af Amer: >60 mL/min (ref >60)
Glucose, Bld: 213 mg/dL — ABNORMAL HIGH (ref 70–99)
Potassium: 3.2 mmol/L — ABNORMAL LOW (ref 3.5–5.1)
Sodium: 138 mmol/L (ref 135–145)

## 2020-03-21 LAB — LIPID PANEL
Cholesterol: 219 mg/dL — ABNORMAL HIGH (ref 0–200)
HDL: 36 mg/dL — ABNORMAL LOW (ref >40)
LDL Cholesterol: 158 mg/dL — ABNORMAL HIGH (ref 0–99)
Total CHOL/HDL Ratio: 6.1 RATIO
Triglycerides: 126 mg/dL (ref <150)
VLDL: 25 mg/dL (ref 0–40)

## 2020-03-21 LAB — CBC
HCT: 37.1 % — ABNORMAL LOW (ref 39.0–52.0)
Hemoglobin: 12.8 g/dL — ABNORMAL LOW (ref 13.0–17.0)
MCH: 29.3 pg (ref 26.0–34.0)
MCHC: 34.5 g/dL (ref 30.0–36.0)
MCV: 84.9 fL (ref 80.0–100.0)
Platelets: 262 10*3/uL (ref 150–400)
RBC: 4.37 MIL/uL (ref 4.22–5.81)
RDW: 11.4 % — ABNORMAL LOW (ref 11.5–15.5)
WBC: 14.1 10*3/uL — ABNORMAL HIGH (ref 4.0–10.5)
nRBC: 0 % (ref 0.0–0.2)

## 2020-03-21 LAB — GLUCOSE, CAPILLARY
Glucose-Capillary: 264 mg/dL — ABNORMAL HIGH (ref 70–99)
Glucose-Capillary: 205 mg/dL — ABNORMAL HIGH (ref 70–99)
Glucose-Capillary: 151 mg/dL — ABNORMAL HIGH (ref 70–99)
Glucose-Capillary: 146 mg/dL — ABNORMAL HIGH (ref 70–99)
Glucose-Capillary: 325 mg/dL — ABNORMAL HIGH (ref 70–99)

## 2020-03-21 LAB — MAGNESIUM: Magnesium: 1.9 mg/dL (ref 1.7–2.4)

## 2020-03-21 LAB — HEMOGLOBIN A1C
Hgb A1c MFr Bld: 10 % — ABNORMAL HIGH (ref 4.8–5.6)
Mean Plasma Glucose: 240.3 mg/dL

## 2020-03-21 LAB — HIV ANTIBODY (ROUTINE TESTING W REFLEX): HIV Screen 4th Generation wRfx: NONREACTIVE

## 2020-03-21 LAB — TSH: TSH: 33.277 u[IU]/mL — ABNORMAL HIGH (ref 0.350–4.500)

## 2020-03-21 LAB — TROPONIN I (HIGH SENSITIVITY): Troponin I (High Sensitivity): 3 ng/L (ref <18)

## 2020-03-21 LAB — CBG MONITORING, ED: Glucose-Capillary: 241 mg/dL — ABNORMAL HIGH (ref 70–99)

## 2020-03-21 SURGERY — IRRIGATION AND DEBRIDEMENT ABSCESS
Anesthesia: General | Laterality: Right

## 2020-03-21 MED ORDER — PIPERACILLIN-TAZOBACTAM 3.375 G IVPB
3.3750 g | Freq: Three times a day (TID) | INTRAVENOUS | Status: DC
  Administered 2020-03-21 – 2020-03-22 (×5): 3.375 g via INTRAVENOUS
  Filled 2020-03-21 (×4): qty 50

## 2020-03-21 MED ORDER — SODIUM CHLORIDE 0.9 % IV SOLN: INTRAVENOUS | Status: DC

## 2020-03-21 MED ORDER — DEXAMETHASONE SODIUM PHOSPHATE 4 MG/ML IJ SOLN
INTRAMUSCULAR | Status: DC | PRN
  Administered 2020-03-21: 5 mg via INTRAVENOUS

## 2020-03-21 MED ORDER — MIDAZOLAM HCL 2 MG/2ML IJ SOLN
INTRAMUSCULAR | Status: DC | PRN
  Administered 2020-03-21: 2 mg via INTRAVENOUS

## 2020-03-21 MED ORDER — ONDANSETRON HCL 4 MG/2ML IJ SOLN
INTRAMUSCULAR | Status: AC
  Filled 2020-03-21: qty 2

## 2020-03-21 MED ORDER — PHENYLEPHRINE 40 MCG/ML (10ML) SYRINGE FOR IV PUSH (FOR BLOOD PRESSURE SUPPORT)
PREFILLED_SYRINGE | INTRAVENOUS | Status: AC
  Filled 2020-03-21: qty 10

## 2020-03-21 MED ORDER — MORPHINE SULFATE (PF) 4 MG/ML IV SOLN
4.0000 mg | INTRAVENOUS | Status: DC | PRN
  Filled 2020-03-21: qty 1

## 2020-03-21 MED ORDER — ENOXAPARIN SODIUM 40 MG/0.4ML ~~LOC~~ SOLN: 40.0000 mg | SUBCUTANEOUS | Status: DC

## 2020-03-21 MED ORDER — LACTATED RINGERS IV SOLN: INTRAVENOUS | Status: DC | PRN

## 2020-03-21 MED ORDER — FENTANYL CITRATE (PF) 250 MCG/5ML IJ SOLN
INTRAMUSCULAR | Status: AC
  Filled 2020-03-21: qty 5

## 2020-03-21 MED ORDER — TRAZODONE HCL 50 MG PO TABS
25.0000 mg | ORAL_TABLET | Freq: Every evening | ORAL | Status: DC | PRN
  Administered 2020-03-22: 25 mg via ORAL
  Filled 2020-03-21: qty 1

## 2020-03-21 MED ORDER — MORPHINE SULFATE (PF) 2 MG/ML IV SOLN
2.0000 mg | INTRAVENOUS | Status: DC | PRN
  Administered 2020-03-21 – 2020-03-22 (×4): 2 mg via INTRAVENOUS
  Filled 2020-03-21 (×4): qty 1

## 2020-03-21 MED ORDER — MORPHINE SULFATE (PF) 2 MG/ML IV SOLN
2.0000 mg | INTRAVENOUS | Status: DC | PRN
  Administered 2020-03-21: 2 mg via INTRAVENOUS

## 2020-03-21 MED ORDER — VANCOMYCIN HCL IN DEXTROSE 1-5 GM/200ML-% IV SOLN
1000.0000 mg | Freq: Two times a day (BID) | INTRAVENOUS | Status: DC
  Administered 2020-03-21 – 2020-03-23 (×5): 1000 mg via INTRAVENOUS
  Filled 2020-03-21 (×6): qty 200

## 2020-03-21 MED ORDER — MIDAZOLAM HCL 2 MG/2ML IJ SOLN
INTRAMUSCULAR | Status: AC
  Filled 2020-03-21: qty 2

## 2020-03-21 MED ORDER — SODIUM CHLORIDE 0.9 % IV SOLN: Freq: Once | INTRAVENOUS | Status: AC

## 2020-03-21 MED ORDER — ONDANSETRON HCL 4 MG/2ML IJ SOLN
INTRAMUSCULAR | Status: DC | PRN
  Administered 2020-03-21: 4 mg via INTRAVENOUS

## 2020-03-21 MED ORDER — ONDANSETRON HCL 4 MG PO TABS: 4.0000 mg | ORAL_TABLET | Freq: Four times a day (QID) | ORAL | Status: DC | PRN

## 2020-03-21 MED ORDER — INSULIN GLARGINE 100 UNIT/ML ~~LOC~~ SOLN
30.0000 [IU] | Freq: Every day | SUBCUTANEOUS | Status: DC
  Administered 2020-03-21: 30 [IU] via SUBCUTANEOUS
  Filled 2020-03-21 (×2): qty 0.3

## 2020-03-21 MED ORDER — DEXAMETHASONE SODIUM PHOSPHATE 10 MG/ML IJ SOLN
INTRAMUSCULAR | Status: AC
  Filled 2020-03-21: qty 1

## 2020-03-21 MED ORDER — PHENYLEPHRINE 40 MCG/ML (10ML) SYRINGE FOR IV PUSH (FOR BLOOD PRESSURE SUPPORT)
PREFILLED_SYRINGE | INTRAVENOUS | Status: DC | PRN
  Administered 2020-03-21: 120 ug via INTRAVENOUS
  Administered 2020-03-21: 80 ug via INTRAVENOUS
  Administered 2020-03-21: 120 ug via INTRAVENOUS

## 2020-03-21 MED ORDER — CELECOXIB 200 MG PO CAPS
200.0000 mg | ORAL_CAPSULE | Freq: Once | ORAL | Status: AC
  Administered 2020-03-21: 200 mg via ORAL
  Filled 2020-03-21: qty 1

## 2020-03-21 MED ORDER — LEVOTHYROXINE SODIUM 112 MCG PO TABS
112.0000 ug | ORAL_TABLET | Freq: Every day | ORAL | Status: DC
  Administered 2020-03-21 – 2020-03-23 (×3): 112 ug via ORAL
  Filled 2020-03-21 (×3): qty 1

## 2020-03-21 MED ORDER — ONDANSETRON HCL 4 MG/2ML IJ SOLN
4.0000 mg | Freq: Four times a day (QID) | INTRAMUSCULAR | Status: DC | PRN
  Administered 2020-03-21: 4 mg via INTRAVENOUS
  Filled 2020-03-21: qty 2

## 2020-03-21 MED ORDER — NITROGLYCERIN 0.4 MG SL SUBL: 0.4000 mg | SUBLINGUAL_TABLET | SUBLINGUAL | Status: DC | PRN

## 2020-03-21 MED ORDER — LIDOCAINE 2% (20 MG/ML) 5 ML SYRINGE
INTRAMUSCULAR | Status: AC
  Filled 2020-03-21: qty 5

## 2020-03-21 MED ORDER — PROPOFOL 10 MG/ML IV BOLUS
INTRAVENOUS | Status: DC | PRN
  Administered 2020-03-21: 200 mg via INTRAVENOUS

## 2020-03-21 MED ORDER — ASPIRIN EC 81 MG PO TBEC
81.0000 mg | DELAYED_RELEASE_TABLET | Freq: Every day | ORAL | Status: DC
  Administered 2020-03-21 – 2020-03-23 (×3): 81 mg via ORAL
  Filled 2020-03-21 (×3): qty 1

## 2020-03-21 MED ORDER — ACETAMINOPHEN 650 MG RE SUPP: 650.0000 mg | Freq: Four times a day (QID) | RECTAL | Status: DC | PRN

## 2020-03-21 MED ORDER — MAGNESIUM HYDROXIDE 400 MG/5ML PO SUSP: 30.0000 mL | Freq: Every day | ORAL | Status: DC | PRN

## 2020-03-21 MED ORDER — PROPOFOL 10 MG/ML IV BOLUS
INTRAVENOUS | Status: AC
  Filled 2020-03-21: qty 20

## 2020-03-21 MED ORDER — BUPIVACAINE-EPINEPHRINE 0.25% -1:200000 IJ SOLN
INTRAMUSCULAR | Status: DC | PRN
  Administered 2020-03-21: 10 mL

## 2020-03-21 MED ORDER — INSULIN ASPART 100 UNIT/ML ~~LOC~~ SOLN
5.0000 [IU] | Freq: Once | SUBCUTANEOUS | Status: AC
  Administered 2020-03-21: 5 [IU] via SUBCUTANEOUS
  Filled 2020-03-21: qty 0.05

## 2020-03-21 MED ORDER — FENTANYL CITRATE (PF) 100 MCG/2ML IJ SOLN: 25.0000 ug | INTRAMUSCULAR | Status: DC | PRN

## 2020-03-21 MED ORDER — HYDROCODONE-ACETAMINOPHEN 5-325 MG PO TABS
1.0000 | ORAL_TABLET | ORAL | Status: DC | PRN
  Administered 2020-03-21: 1 via ORAL
  Administered 2020-03-22: 2 via ORAL
  Filled 2020-03-21: qty 2
  Filled 2020-03-21: qty 1

## 2020-03-21 MED ORDER — TAMSULOSIN HCL 0.4 MG PO CAPS
0.4000 mg | ORAL_CAPSULE | Freq: Every day | ORAL | Status: DC
  Administered 2020-03-21 – 2020-03-23 (×3): 0.4 mg via ORAL
  Filled 2020-03-21 (×3): qty 1

## 2020-03-21 MED ORDER — ACETAMINOPHEN 325 MG PO TABS
650.0000 mg | ORAL_TABLET | Freq: Four times a day (QID) | ORAL | Status: DC | PRN
  Filled 2020-03-21: qty 2

## 2020-03-21 MED ORDER — METOPROLOL TARTRATE 100 MG PO TABS: 100.0000 mg | ORAL_TABLET | Freq: Once | ORAL | 0 refills | Status: DC

## 2020-03-21 MED ORDER — ACETAMINOPHEN 500 MG PO TABS
1000.0000 mg | ORAL_TABLET | Freq: Once | ORAL | Status: AC
  Administered 2020-03-21: 1000 mg via ORAL
  Filled 2020-03-21: qty 2

## 2020-03-21 MED ORDER — PROMETHAZINE HCL 25 MG/ML IJ SOLN: 6.2500 mg | INTRAMUSCULAR | Status: DC | PRN

## 2020-03-21 MED ORDER — LIDOCAINE 2% (20 MG/ML) 5 ML SYRINGE
INTRAMUSCULAR | Status: DC | PRN
  Administered 2020-03-21: 100 mg via INTRAVENOUS

## 2020-03-21 MED ORDER — INSULIN ASPART 100 UNIT/ML ~~LOC~~ SOLN
0.0000 [IU] | Freq: Three times a day (TID) | SUBCUTANEOUS | Status: DC
  Filled 2020-03-21: qty 0.2

## 2020-03-21 MED ORDER — POTASSIUM CHLORIDE 20 MEQ PO PACK
40.0000 meq | PACK | Freq: Once | ORAL | Status: AC
  Administered 2020-03-21: 40 meq via ORAL
  Filled 2020-03-21: qty 2

## 2020-03-21 MED ORDER — ATORVASTATIN CALCIUM 40 MG PO TABS
40.0000 mg | ORAL_TABLET | Freq: Every day | ORAL | Status: DC
  Administered 2020-03-21 – 2020-03-23 (×3): 40 mg via ORAL
  Filled 2020-03-21 (×3): qty 1

## 2020-03-21 MED ORDER — POTASSIUM CHLORIDE 20 MEQ PO PACK: 40.0000 meq | PACK | Freq: Once | ORAL | Status: DC

## 2020-03-21 MED ORDER — FENTANYL CITRATE (PF) 100 MCG/2ML IJ SOLN
INTRAMUSCULAR | Status: DC | PRN
Start: 2020-03-21 — End: 2020-03-21
  Administered 2020-03-21 (×2): 50 ug via INTRAVENOUS
  Administered 2020-03-21: 100 ug via INTRAVENOUS
  Administered 2020-03-21: 50 ug via INTRAVENOUS

## 2020-03-21 MED ORDER — INSULIN ASPART 100 UNIT/ML ~~LOC~~ SOLN
0.0000 [IU] | Freq: Four times a day (QID) | SUBCUTANEOUS | Status: DC
  Administered 2020-03-21: 15 [IU] via SUBCUTANEOUS
  Administered 2020-03-21: 7 [IU] via SUBCUTANEOUS
  Administered 2020-03-21 – 2020-03-22 (×2): 11 [IU] via SUBCUTANEOUS
  Administered 2020-03-22: 2 [IU] via SUBCUTANEOUS
  Administered 2020-03-22: 7 [IU] via SUBCUTANEOUS
  Administered 2020-03-22: 4 [IU] via SUBCUTANEOUS

## 2020-03-21 MED ORDER — BUPIVACAINE-EPINEPHRINE (PF) 0.25% -1:200000 IJ SOLN
INTRAMUSCULAR | Status: AC
  Filled 2020-03-21: qty 30

## 2020-03-21 SURGICAL SUPPLY — 35 items
APL SKNCLS STERI-STRIP NONHPOA (GAUZE/BANDAGES/DRESSINGS)
BENZOIN TINCTURE PRP APPL 2/3 (GAUZE/BANDAGES/DRESSINGS) ×1 IMPLANT
BLADE HEX COATED 2.75 (ELECTRODE) ×1 IMPLANT
BLADE SURG 15 STRL LF DISP TIS (BLADE) IMPLANT
BLADE SURG 15 STRL SS (BLADE)
BLADE SURG SZ10 CARB STEEL (BLADE) IMPLANT
CLOSURE WOUND 1/2 X4 (GAUZE/BANDAGES/DRESSINGS)
COVER SURGICAL LIGHT HANDLE (MISCELLANEOUS) ×3 IMPLANT
COVER WAND RF STERILE (DRAPES) IMPLANT
DECANTER SPIKE VIAL GLASS SM (MISCELLANEOUS) ×3 IMPLANT
DRAIN PENROSE 0.5X18 (DRAIN) IMPLANT
DRAPE LAPAROTOMY TRNSV 102X78 (DRAPES) ×3 IMPLANT
ELECT REM PT RETURN 15FT ADLT (MISCELLANEOUS) ×3 IMPLANT
GAUZE SPONGE 4X4 12PLY STRL (GAUZE/BANDAGES/DRESSINGS) ×3 IMPLANT
GLOVE BIOGEL PI IND STRL 7.0 (GLOVE) ×1 IMPLANT
GLOVE BIOGEL PI INDICATOR 7.0 (GLOVE) ×10
GLOVE SURG SYN 7.5  E (GLOVE) ×3
GLOVE SURG SYN 7.5 E (GLOVE) ×1 IMPLANT
GLOVE SURG SYN 7.5 PF PI (GLOVE) ×1 IMPLANT
GOWN SPEC L4 XLG W/TWL (GOWN DISPOSABLE) ×3 IMPLANT
GOWN STRL REUS W/ TWL XL LVL3 (GOWN DISPOSABLE) ×3 IMPLANT
GOWN STRL REUS W/TWL LRG LVL3 (GOWN DISPOSABLE) ×3 IMPLANT
GOWN STRL REUS W/TWL XL LVL3 (GOWN DISPOSABLE)
KIT BASIN OR (CUSTOM PROCEDURE TRAY) ×3 IMPLANT
KIT TURNOVER KIT A (KITS) ×2 IMPLANT
NEEDLE HYPO 25X1 1.5 SAFETY (NEEDLE) ×3 IMPLANT
NS IRRIG 1000ML POUR BTL (IV SOLUTION) ×3 IMPLANT
PACK BASIC VI WITH GOWN DISP (CUSTOM PROCEDURE TRAY) ×3 IMPLANT
PENCIL SMOKE EVACUATOR (MISCELLANEOUS) IMPLANT
SPONGE LAP 4X18 RFD (DISPOSABLE) ×2 IMPLANT
STRIP CLOSURE SKIN 1/2X4 (GAUZE/BANDAGES/DRESSINGS) IMPLANT
SYR BULB IRRIG 60ML STRL (SYRINGE) IMPLANT
SYR CONTROL 10ML LL (SYRINGE) ×1 IMPLANT
TOWEL OR 17X26 10 PK STRL BLUE (TOWEL DISPOSABLE) ×3 IMPLANT
YANKAUER SUCT BULB TIP 10FT TU (MISCELLANEOUS) ×1 IMPLANT

## 2020-03-21 NOTE — Progress Notes (Signed)
°  Echocardiogram 2D Echocardiogram has been performed.  Tye Savoy 03/21/2020, 12:10 PM

## 2020-03-21 NOTE — H&P (Signed)
[facility]   PATIENT NAME: Kevin Oneill    MR#:  [id number]  DATE OF BIRTH:  1966/05/21  DATE OF ADMISSION:  03/20/2020  PRIMARY CARE PHYSICIAN: Patient, No Pcp Per   REQUESTING/REFERRING PHYSICIAN: Molpus, John, MD  CHIEF COMPLAINT:   Chief Complaint  Patient presents with  . Groin Swelling    HISTORY OF PRESENT ILLNESS:  Kevin Oneill  is a 54 y.o. African-American male with a known history of type diabetes mellitus and hypothyroidism, presented to the emergency room with acute onset of right groin swelling with associated tenderness, warmth, erythema and pain since Friday.  He has not been taking his diabetic medications including insulin and Metformin for 2 years.  He admitted to fever and chills.  He denied any nausea or vomiting.  He had midsternal chest pain felt as a dull aching pain and graded 7/10 in severity with associated dyspnea, diaphoresis and palpitations.  He denies any associated nausea or vomiting or any radiation of his pain.  He admits to urinary hesitancy with low stream.  No cough or wheezing or hemoptysis.  Upon presentation to the emergency room, heart rate was 124 with otherwise normal vital signs.  Labs revealed mild hyponatremia and a potassium of 3.5 with hyperglycemia of 392, creatinine 1.24 with BUN of 14.  High-sensitivity troponin I was 3 and lactic acid was 1.8 CBC showed leukocytosis of 12.9.  UA showed more than 500 glucose, 100 protein, 20 ketones and 6-10 WBCs with no bacteria and negative nitrite.  Urine culture was sent as well as 2 blood cultures.  COVID-19 PCR came back negative chest x-ray showed no acute intrathoracic abnormalities.  Chest and abdomen CTA revealed no PE or acute thoracic abnormality.  CT of the abdomen and pelvis revealed a right peroneal abscess measuring up to 7.2 X5.1X 2 cm.  The patient was given IV vancomycin and Zosyn, 5 units subcu NovoLog, hydration with IV normal saline and 4 mg of IV morphine sulfate.  Dr. Carolynne Edouard  was notified but the patient and he is aware.  The patient will be admitted to a medical bed for further evaluation and management  PAST MEDICAL HISTORY:   Past Medical History:  Diagnosis Date  . Diabetes mellitus without complication (HCC)   . Thyroid disease     PAST SURGICAL HISTORY:  History reviewed. No pertinent surgical history.  He denied any previous surgeries  SOCIAL HISTORY:   Social History   Tobacco Use  . Smoking status: Current Every Day Smoker    Packs/day: 1.00    Types: Cigarettes  . Smokeless tobacco: Never Used  Substance Use Topics  . Alcohol use: Yes    Comment: occassional    FAMILY HISTORY:  No family history on file.  No pertinent familial diseases  DRUG ALLERGIES:   Allergies  Allergen Reactions  . Bee Venom Anaphylaxis    REVIEW OF SYSTEMS:   ROS As per history of present illness. All pertinent systems were reviewed above. Constitutional, HEENT, cardiovascular, respiratory, GI, GU, musculoskeletal, neuro, psychiatric, endocrine, integumentary and hematologic systems were reviewed and are otherwise negative/unremarkable except for positive findings mentioned above in the HPI.   MEDICATIONS AT HOME:   Prior to Admission medications   Medication Sig Start Date End Date Taking? Authorizing Provider  diphenhydrAMINE (BENADRYL) 25 MG tablet Take 25 mg by mouth every 6 (six) hours as needed.   Yes [provider]  clindamycin (CLEOCIN) 300 MG capsule Take 1 capsule (300 mg  total) by mouth 3 (three) times daily. 03/20/20   Charlynne PanderYao, David Hsienta, MD  HYDROcodone-acetaminophen (NORCO/VICODIN) 5-325 MG tablet Take 1 tablet by mouth every 6 (six) hours as needed. 03/20/20   Charlynne PanderYao, David Hsienta, MD  ibuprofen (ADVIL) 800 MG tablet Take 1 tablet (800 mg total) by mouth 3 (three) times daily. Patient not taking: Reported on 03/20/2020 02/27/20   Roxy HorsemanBrowning, Pedrohenrique, PA-C  insulin glargine (LANTUS) 100 UNIT/ML injection Inject 30 Units into the skin  daily. Patient not taking: Reported on 03/20/2020 03/08/17   [provider]  levothyroxine (SYNTHROID, LEVOTHROID) 112 MCG tablet Take 112 mcg by mouth daily before breakfast. Patient not taking: Reported on 03/20/2020    [provider]  metFORMIN (GLUCOPHAGE) 500 MG tablet Take 1 tablet (500 mg total) by mouth 2 (two) times daily with a meal. 03/20/20   Charlynne PanderYao, David Hsienta, MD  promethazine (PHENERGAN) 25 MG tablet Take 1 tablet (25 mg total) by mouth every 6 (six) hours as needed for nausea. Patient not taking: Reported on 01/28/2017 07/03/12 03/21/20  Rolan BuccoBelfi, Melanie, MD      VITAL SIGNS:  Blood pressure 133/87, pulse (!) 115, temperature 100 F (37.8 C), temperature source Oral, resp. rate (!) 22, height 5\' 9"  (1.753 m), weight 88 kg, SpO2 99 %.  PHYSICAL EXAMINATION:  Physical Exam  GENERAL:  54 y.o.-year-old African-American male patient lying in the bed with no acute distress.  EYES: Pupils equal, round, reactive to light and accommodation. No scleral icterus. Extraocular muscles intact.  HEENT: Head atraumatic, normocephalic. Oropharynx and nasopharynx clear.  NECK:  Supple, no jugular venous distention. No thyroid enlargement, no tenderness.  LUNGS: Normal breath sounds bilaterally, no wheezing, rales,rhonchi or crepitation. No use of accessory muscles of respiration.  CARDIOVASCULAR: Regular rate and rhythm, S1, S2 normal. No murmurs, rubs, or gallops.  ABDOMEN: Soft, nondistended, nontender. Bowel sounds present. No organomegaly or mass.  EXTREMITIES: No pedal edema, cyanosis, or clubbing.  NEUROLOGIC: Cranial nerves II through XII are intact. Muscle strength 5/5 in all extremities. Sensation intact. Gait not checked.  PSYCHIATRIC: The patient is alert and oriented x 3.  Normal affect and good eye contact. SKIN: Right groin swelling with associated tenderness, warmth and fluctuation extending to the perineal area without scrotal involvement.     LABORATORY PANEL:     CBC Recent Labs  Lab 03/20/20 2034  WBC 12.9*  HGB 13.9  HCT 41.4  PLT 257   ------------------------------------------------------------------------------------------------------------------  Chemistries  Recent Labs  Lab 03/20/20 2034  NA 134*  K 3.5  CL 97*  CO2 25  GLUCOSE 392*  BUN 14  CREATININE 1.24  CALCIUM 8.7*  AST 23  ALT 34  ALKPHOS 96  BILITOT 1.2   ------------------------------------------------------------------------------------------------------------------  Cardiac Enzymes No results for input(s): TROPONINI in the last 168 hours. ------------------------------------------------------------------------------------------------------------------  RADIOLOGY:  CT Angio Chest PE W and/or Wo Contrast  Result Date: 03/20/2020 CLINICAL DATA:  Chest pain and shortness of breath EXAM: CT ANGIOGRAPHY CHEST WITH CONTRAST TECHNIQUE: Multidetector CT imaging of the chest was performed using the standard protocol during bolus administration of intravenous contrast. Multiplanar CT image reconstructions and MIPs were obtained to evaluate the vascular anatomy. CONTRAST:  100mL OMNIPAQUE IOHEXOL 350 MG/ML SOLN COMPARISON:  None. FINDINGS: Cardiovascular: Contrast injection is sufficient to demonstrate satisfactory opacification of the pulmonary arteries to the segmental level. There is no pulmonary embolus or evidence of right heart strain. The size of the main pulmonary artery is normal. Heart size is normal, with no pericardial  effusion. The course and caliber of the aorta are normal. There is no atherosclerotic calcification. Opacification decreased due to pulmonary arterial phase contrast bolus timing. Mediastinum/Nodes: No mediastinal, hilar or axillary lymphadenopathy. Normal visualized thyroid. Thoracic esophageal course is normal. Lungs/Pleura: Airways are patent. Bibasilar atelectasis. No pleural effusion, lobar consolidation, pneumothorax or pulmonary infarction.  Upper Abdomen: Contrast bolus timing is not optimized for evaluation of the abdominal organs. The visualized portions of the organs of the upper abdomen are normal. Musculoskeletal: No chest wall abnormality. No bony spinal canal stenosis. Review of the MIP images confirms the above findings. findings IMPRESSION: No pulmonary embolus or other acute thoracic abnormality. Electronically Signed   By: Deatra Robinson M.D.   On: 03/20/2020 23:04   CT ABDOMEN PELVIS W CONTRAST  Result Date: 03/20/2020 CLINICAL DATA:  Perineal swelling EXAM: CT ABDOMEN AND PELVIS WITH CONTRAST TECHNIQUE: Multidetector CT imaging of the abdomen and pelvis was performed using the standard protocol following bolus administration of intravenous contrast. CONTRAST:  OMNIPAQUE IOHEXOL 350 MG/ML SOLN COMPARISON:  01/28/2017 FINDINGS: Hepatobiliary: Normal hepatic contours and density. No visible biliary dilatation. Normal gallbladder. Pancreas: Normal contours without ductal dilatation. No peripancreatic fluid collection. Spleen: Normal. Adrenals/Urinary Tract: --Adrenal glands: Normal. --Right kidney/ureter: No hydronephrosis or perinephric stranding. No nephrolithiasis. No obstructing ureteral stones. --Left kidney/ureter: No hydronephrosis or perinephric stranding. No nephrolithiasis. No obstructing ureteral stones. --Urinary bladder: Unremarkable. Stomach/Bowel: --Stomach/Duodenum: No hiatal hernia or other gastric abnormality. Normal duodenal course and caliber. --Small bowel: No dilatation or inflammation. --Colon: No focal abnormality. --Appendix: Normal. Vascular/Lymphatic: Normal course and caliber of the major abdominal vessels. No abdominal or pelvic lymphadenopathy. Reproductive: Headache there is a gas and fluid containing right perineal collection that measures 7.2 x 5.1 x 2.0 cm. Musculoskeletal. No bony spinal canal stenosis or focal osseous abnormality. Other: None. IMPRESSION: Right perineal abscess measuring up to 7.2 x  5.1 x 2.0 cm. Electronically Signed   By: Deatra Robinson M.D.   On: 03/20/2020 23:51   DG Chest Port 1 View  Result Date: 03/20/2020 CLINICAL DATA:  Shortness of breath EXAM: PORTABLE CHEST 1 VIEW COMPARISON:  07/03/2012 FINDINGS: The heart size and mediastinal contours are within normal limits. Both lungs are clear. The visualized skeletal structures are unremarkable. IMPRESSION: No active disease. Electronically Signed   By: Alcide Clever M.D.   On: 03/20/2020 20:26      IMPRESSION AND PLAN:   1.  Right groin abscess with surrounding severe nonpurulent cellulitis.. -The patient will be admitted to a medical bed. -We will continue antibiotic therapy with IV vancomycin and Zosyn for moderately severe surrounding nonpurulent cellulitis.  IV vancomycin should help his abscess. -General surgery consultation will be obtained. -Dr. Carolynne Edouard was notified and is aware about the patient.  2.  Uncontrolled type 2 diabetes mellitus with hyperglycemia. -The patient will be placed on resistant subcu NovoLog supplemental coverage. -We will follow fingerstick blood glucose measures. -He will be hydrated with IV normal saline. -Metformin is being held off.  3.  Chest pain, rule out acute coronary syndrome. -We will follow serial cardiac enzymes. -He will be placed on "baby aspirin. -We will check fasting lipids. -The patient will be placed on as needed sublingual nitroglycerin and morphine sulfate for pain.  -Cardiology consultation will be obtained for further cardiac risk stratification. -I notified Dr. Molli Hazard about the patient.  4.  Hypothyroidism. -We will continue Synthroid and check TSH.  5.  DVT prophylaxis. -Subcutaneous Lovenox   All the records are reviewed and  case discussed with ED provider. The plan of care was discussed in details with the patient (and family). I answered all questions. The patient agreed to proceed with the above mentioned plan. Further management will depend upon  hospital course.   CODE STATUS: Full code  Status is: Inpatient  Remains inpatient appropriate because:Ongoing active pain requiring inpatient pain management, Ongoing diagnostic testing needed not appropriate for outpatient work up, Unsafe d/c plan, IV treatments appropriate due to intensity of illness or inability to take PO and Inpatient level of care appropriate due to severity of illness   Dispo: The patient is from: Home              Anticipated d/c is to: Home              Anticipated d/c date is: 2 days              Patient currently is not medically stable to d/c.    TOTAL TIME TAKING CARE OF THIS PATIENT: 55 minutes.    Hannah Beat M.D on 03/21/2020 at 12:58 AM  [facility]   From 7 PM-7 AM, contact night-coverage [email or web address]  CC: Primary care physician; Patient, No Pcp Per   Note: This dictation was prepared with Dragon dictation along with smaller phrase technology. Any transcriptional typo errors that result from this process are unintentional.

## 2020-03-21 NOTE — Progress Notes (Signed)
Pharmacy Antibiotic Note  Kevin Oneill is a 54 y.o. male admitted on 03/20/2020 with cellulitis.  Pt notes progressive right scrotal swelling after insect bite several days ago.  Pharmacy has been consulted for Zosyn & Vancomycin dosing.  Low grade fever- Tm 100.0 Mild leukocytosis, LA WNL Glucose>300 Scr 1.24- appears to be patient's baseline Abdominal CT+ R perineal abscess; Surgery consult pending   Plan: Zosyn 3.375g IV q8h (4 hour infusion).  Vancomycin 1gm IV q12h Monitor renal function and cx data  Daily Scr while on Vanc/Zosyn combination Check Vanc trough at steady state  Height: 5\' 9"  (175.3 cm) Weight: 88 kg (194 lb) IBW/kg (Calculated) : 70.7  Temp (24hrs), Avg:100 F (37.8 C), Min:100 F (37.8 C), Max:100 F (37.8 C)  Recent Labs  Lab 03/20/20 2034  WBC 12.9*  CREATININE 1.24  LATICACIDVEN 1.8    Estimated Creatinine Clearance: 74.7 mL/min (by C-G formula based on SCr of 1.24 mg/dL).    Allergies  Allergen Reactions  . Bee Venom Anaphylaxis    Antimicrobials this admission: 9/19 Vanc >>  9/19 Zosyn >>   Dose adjustments this admission:  Microbiology results: 9/19 BCx:  9/19 UCx:   9/19 COVID: negative  Thank you for allowing pharmacy to be a part of this patient's care.  10/19 PharmD, BCPS 03/21/2020 1:01 AM

## 2020-03-21 NOTE — Progress Notes (Signed)
PROGRESS NOTE    Kevin PoserRobert N Oneill  UEA:[id number]:[id number] DOB: 07-Jul-1965 DOA: 03/20/2020 PCP: Patient, No Pcp Per    Chief Complaint  Patient presents with  . Groin Swelling    Brief Narrative:  HPI per Dr. Rockie NeighboursMansy Dameir Ladona Ridgelaylor  is a 54 y.o. African-American male with a known history of type diabetes mellitus and hypothyroidism, presented to the emergency room with acute onset of right groin swelling with associated tenderness, warmth, erythema and pain since Friday.  He has not been taking his diabetic medications including insulin and Metformin for 2 years.  He admitted to fever and chills.  He denied any nausea or vomiting.  He had midsternal chest pain felt as a dull aching pain and graded 7/10 in severity with associated dyspnea, diaphoresis and palpitations.  He denies any associated nausea or vomiting or any radiation of his pain.  He admits to urinary hesitancy with low stream.  No cough or wheezing or hemoptysis.  Upon presentation to the emergency room, heart rate was 124 with otherwise normal vital signs.  Labs revealed mild hyponatremia and a potassium of 3.5 with hyperglycemia of 392, creatinine 1.24 with BUN of 14.  High-sensitivity troponin I was 3 and lactic acid was 1.8 CBC showed leukocytosis of 12.9.  UA showed more than 500 glucose, 100 protein, 20 ketones and 6-10 WBCs with no bacteria and negative nitrite.  Urine culture was sent as well as 2 blood cultures.  COVID-19 PCR came back negative chest x-ray showed no acute intrathoracic abnormalities.  Chest and abdomen CTA revealed no PE or acute thoracic abnormality.  CT of the abdomen and pelvis revealed a right peroneal abscess measuring up to 7.2 X5.1X 2 cm.  The patient was given IV vancomycin and Zosyn, 5 units subcu NovoLog, hydration with IV normal saline and 4 mg of IV morphine sulfate.  Dr. Carolynne Edouardoth was notified but the patient and he is aware.  The patient will be admitted to a medical bed for further evaluation and  management  Assessment & Plan:   Active Problems:   Groin abscess  1 right groin abscess/right perineal abscess with surrounding severe nonpurulent cellulitis. Patient presented with right groin pain, CT done concerning for right perineal abscess.  Patient seen by general surgery and patient for incision and drainage today in the OR per general surgery.  Continue IV vancomycin and IV Zosyn.  Follow.  2.  Chest pain Patient presented with chest pain with atypical features however patient with multiple risk factors of poorly controlled diabetes, hyperlipidemia, hypothyroidism, ongoing tobacco use.  Patient currently chest pain-free.  EKG with no ischemic changes noted.  Cardiac enzymes that were cycled negative.  Check a 2D echo.  Due to multiple cardiac risk factors cardiology was consulted for further evaluation and management.  Start patient on Lipitor, aspirin.  Follow.  3.  Hypothyroidism TSH noted to be elevated 33.277.  Patient noted to have not taken any medications in 2 years.  Patient's home regimen of Synthroid has been resumed.  Will need repeat thyroid function studies done in about 4 to 6 weeks.  Outpatient follow-up with PCP.  4.  Tobacco abuse Tobacco cessation.  5.  Hyperlipidemia Fasting lipid panel with LDL of 158.  Start Lipitor.  Follow.  6.  Uncontrolled diabetes mellitus type 2 Patient noted to be hyperglycemic on admission.  Patient hydrated with IV fluids.  Patient has not been on his oral hypoglycemic agents in over 2 years as he states due to financial issues,  lost his insurance and as such states PCP would not see him anymore.  Hemoglobin A1c 10.0.  CBG at 264 this morning.  Continue Lantus 30 units daily, sliding scale insulin.  Consult with diabetic coordinator.  7.  Hypokalemia Check a magnesium level.  Replete.  DVT prophylaxis: SCDs Code Status: Full Family Communication: Updated patient.  No family at bedside. Disposition:   Status is:  Inpatient    Dispo: The patient is from: Home              Anticipated d/c is to: Home              Anticipated d/c date is: 3 to 4 days.              Patient currently on IV antibiotics, going to go for incision and drainage of the abscess in the OR today.  Not stable for discharge.       Consultants:   General surgery: Dr. Ezzard Standing 03/21/2020  Procedures:   2D echo pending 03/21/2020  CT abdomen and pelvis 03/20/2020  CT angiogram chest 03/20/2020  Chest x-ray 03/20/2020  Antimicrobials:   IV Zosyn 03/20/2020>>>>  IV Vancomycin 03/20/2020 >>>>>>   Subjective: Patient laying in bed.  Denies any chest pain or shortness of breath.  Stated prior to admission had some intermittent chest pain which was nonradiating on exertion when he was going to the bathroom.  Denies any ongoing chest pain at this time.  Seen by general surgery awaiting to go to the OR for incision and drainage.  States does not have insurance and as such was unable to afford his medications and as such has not taken his medications in approximately 2 years.  Objective: Vitals:   03/21/20 0300 03/21/20 0330 03/21/20 0429 03/21/20 1003  BP: 114/77 122/74 122/74 112/67  Pulse: (!) 110 (!) 116 (!) 110 86  Resp: (!) 22 14 16 18   Temp:   (!) 100.4 F (38 C) 99 F (37.2 C)  TempSrc:   Oral Oral  SpO2: 98% 100% 98% 100%  Weight:      Height:        Intake/Output Summary (Last 24 hours) at 03/21/2020 1045 Last data filed at 03/21/2020 1000 Gross per 24 hour  Intake 582.42 ml  Output 0 ml  Net 582.42 ml   Filed Weights   03/20/20 1950  Weight: 88 kg    Examination:  General exam: Appears calm and comfortable  Respiratory system: Clear to auscultation. Respiratory effort normal. Cardiovascular system: S1 & S2 heard, RRR. No JVD, murmurs, rubs, gallops or clicks. No pedal edema. Gastrointestinal system: Abdomen is nondistended, soft and nontender. No organomegaly or masses felt. Normal bowel sounds  heard. Central nervous system: Alert and oriented. No focal neurological deficits. Extremities: Symmetric 5 x 5 power. GU: Right groin/scrotal region with induration exquisitely tender to palpation. Skin: No rashes, lesions or ulcers Psychiatry: Judgement and insight appear normal. Mood & affect appropriate.       Data Reviewed: I have personally reviewed following labs and imaging studies  CBC: Recent Labs  Lab 03/20/20 2034 03/21/20 0338  WBC 12.9* 14.1*  HGB 13.9 12.8*  HCT 41.4 37.1*  MCV 86.1 84.9  PLT 257 262    Basic Metabolic Panel: Recent Labs  Lab 03/20/20 2034 03/21/20 0616  NA 134* 138  K 3.5 3.2*  CL 97* 100  CO2 25 23  GLUCOSE 392* 213*  BUN 14 14  CREATININE 1.24 1.02  CALCIUM 8.7* 8.8*  GFR: Estimated Creatinine Clearance: 90.9 mL/min (by C-G formula based on SCr of 1.02 mg/dL).  Liver Function Tests: Recent Labs  Lab 03/20/20 2034  AST 23  ALT 34  ALKPHOS 96  BILITOT 1.2  PROT 8.0  ALBUMIN 3.6    CBG: Recent Labs  Lab 03/20/20 2229 03/21/20 0022 03/21/20 0803  GLUCAP 349* 241* 264*     Recent Results (from the past 240 hour(s))  SARS Coronavirus 2 by RT PCR (hospital order, performed in [facility] hospital lab) Nasopharyngeal Nasopharyngeal Swab     Status: None   Collection Time: 03/20/20  8:54 PM   Specimen: Nasopharyngeal Swab  Result Value Ref Range Status   SARS Coronavirus 2 NEGATIVE NEGATIVE Final    Comment: (NOTE) SARS-CoV-2 target nucleic acids are NOT DETECTED.  The SARS-CoV-2 RNA is generally detectable in upper and lower respiratory specimens during the acute phase of infection. The lowest concentration of SARS-CoV-2 viral copies this assay can detect is 250 copies / mL. A negative result does not preclude SARS-CoV-2 infection and should not be used as the sole basis for treatment or other patient management decisions.  A negative result may occur with improper specimen collection / handling, submission  of specimen other than nasopharyngeal swab, presence of viral mutation(s) within the areas targeted by this assay, and inadequate number of viral copies (<250 copies / mL). A negative result must be combined with clinical observations, patient history, and epidemiological information.  Fact Sheet for Patients:   BoilerBrush.com.cy  Fact Sheet for Healthcare Providers: https://pope.com/  This test is not yet approved or  cleared by the [place] FDA and has been authorized for detection and/or diagnosis of SARS-CoV-2 by FDA under an Emergency Use Authorization (EUA).  This EUA will remain in effect (meaning this test can be used) for the duration of the COVID-19 declaration under Section 564(b)(1) of the Act, 21 U.S.C. section 360bbb-3(b)(1), unless the authorization is terminated or revoked sooner.  Performed at [facility], [address], [place], [place] [address]          Radiology Studies: CT Angio Chest PE W and/or Wo Contrast  Result Date: 03/20/2020 CLINICAL DATA:  Chest pain and shortness of breath EXAM: CT ANGIOGRAPHY CHEST WITH CONTRAST TECHNIQUE: Multidetector CT imaging of the chest was performed using the standard protocol during bolus administration of intravenous contrast. Multiplanar CT image reconstructions and MIPs were obtained to evaluate the vascular anatomy. CONTRAST:  OMNIPAQUE IOHEXOL 350 MG/ML SOLN COMPARISON:  None. FINDINGS: Cardiovascular: Contrast injection is sufficient to demonstrate satisfactory opacification of the pulmonary arteries to the segmental level. There is no pulmonary embolus or evidence of right heart strain. The size of the main pulmonary artery is normal. Heart size is normal, with no pericardial effusion. The course and caliber of the aorta are normal. There is no atherosclerotic calcification. Opacification decreased due to pulmonary arterial phase contrast  bolus timing. Mediastinum/Nodes: No mediastinal, hilar or axillary lymphadenopathy. Normal visualized thyroid. Thoracic esophageal course is normal. Lungs/Pleura: Airways are patent. Bibasilar atelectasis. No pleural effusion, lobar consolidation, pneumothorax or pulmonary infarction. Upper Abdomen: Contrast bolus timing is not optimized for evaluation of the abdominal organs. The visualized portions of the organs of the upper abdomen are normal. Musculoskeletal: No chest wall abnormality. No bony spinal canal stenosis. Review of the MIP images confirms the above findings. findings IMPRESSION: No pulmonary embolus or other acute thoracic abnormality. Electronically Signed   By: Deatra Robinson M.D.   On: 03/20/2020  23:04   CT ABDOMEN PELVIS W CONTRAST  Result Date: 03/20/2020 CLINICAL DATA:  Perineal swelling EXAM: CT ABDOMEN AND PELVIS WITH CONTRAST TECHNIQUE: Multidetector CT imaging of the abdomen and pelvis was performed using the standard protocol following bolus administration of intravenous contrast. CONTRAST:  OMNIPAQUE IOHEXOL 350 MG/ML SOLN COMPARISON:  01/28/2017 FINDINGS: Hepatobiliary: Normal hepatic contours and density. No visible biliary dilatation. Normal gallbladder. Pancreas: Normal contours without ductal dilatation. No peripancreatic fluid collection. Spleen: Normal. Adrenals/Urinary Tract: --Adrenal glands: Normal. --Right kidney/ureter: No hydronephrosis or perinephric stranding. No nephrolithiasis. No obstructing ureteral stones. --Left kidney/ureter: No hydronephrosis or perinephric stranding. No nephrolithiasis. No obstructing ureteral stones. --Urinary bladder: Unremarkable. Stomach/Bowel: --Stomach/Duodenum: No hiatal hernia or other gastric abnormality. Normal duodenal course and caliber. --Small bowel: No dilatation or inflammation. --Colon: No focal abnormality. --Appendix: Normal. Vascular/Lymphatic: Normal course and caliber of the major abdominal vessels. No abdominal or  pelvic lymphadenopathy. Reproductive: Headache there is a gas and fluid containing right perineal collection that measures 7.2 x 5.1 x 2.0 cm. Musculoskeletal. No bony spinal canal stenosis or focal osseous abnormality. Other: None. IMPRESSION: Right perineal abscess measuring up to 7.2 x 5.1 x 2.0 cm. Electronically Signed   By: Deatra Robinson M.D.   On: 03/20/2020 23:51   DG Chest Port 1 View  Result Date: 03/20/2020 CLINICAL DATA:  Shortness of breath EXAM: PORTABLE CHEST 1 VIEW COMPARISON:  07/03/2012 FINDINGS: The heart size and mediastinal contours are within normal limits. Both lungs are clear. The visualized skeletal structures are unremarkable. IMPRESSION: No active disease. Electronically Signed   By: Alcide Clever M.D.   On: 03/20/2020 20:26        Scheduled Meds: . aspirin EC  81 mg Oral Daily  . atorvastatin  40 mg Oral Daily  . insulin aspart  0-20 Units Subcutaneous Q6H  . insulin glargine  30 Units Subcutaneous Daily  . levothyroxine  112 mcg Oral Q0600  . tamsulosin  0.4 mg Oral Daily   Continuous Infusions: . sodium chloride 100 mL/hr at 03/21/20 1016  . piperacillin-tazobactam 3.375 g (03/21/20 0336)  . vancomycin 1,000 mg (03/21/20 0338)     LOS: 0 days    Time spent: 35 minutes  No charge    Ramiro Harvest, MD [facility]   To contact the attending provider between 7A-7P or the covering provider during after hours 7P-7A, please log into the web site [email or web address] and access using universal [place] password for that web site. If you do not have the password, please call the hospital operator.  03/21/2020, 10:45 AM

## 2020-03-21 NOTE — Consult Note (Addendum)
[facility] Surgery Consult Note  Kevin Oneill 1966-05-19  [id number].    Requesting MD: Janee Morn, MD Chief Complaint/Reason for Consult: R groin/perineal abscess   HPI:  Kevin Oneill is a 53 y/o M with a PMH DM2 and hypothyroidism who presented to the ED with cc groin swelling and pain. States the pain started on Friday 9/17 and describes it as tender, non-radiating, and pressure-like. Denies similar pain in the past, denies history of abscess or boil in the past. Associated symptoms include fevers, chills, and urinary hesitancy. Also had some chest pain and SOB on presentation which he states has now stopped. States his last bowel movement was Saturday 9/18 and was non-bloody and not painful. At baseline he lives at home with his fiance. He reports smoking cigarettes daily and drinking alcohol occasionally. He denies a history of any surgeries in the past. NKDA. Denies use of blood thinning medications.   ROS: as above Review of Systems  All other systems reviewed and are negative.  History reviewed. No pertinent family history.  Past Medical History:  Diagnosis Date  . Diabetes mellitus without complication (HCC)   . Thyroid disease     History reviewed. No pertinent surgical history.  Social History:  reports that he has been smoking cigarettes. He has been smoking about 1.00 pack per day. He has never used smokeless tobacco. He reports current alcohol use. He reports that he does not use drugs.  Allergies:  Allergies  Allergen Reactions  . Bee Venom Anaphylaxis    Medications Prior to Admission  Medication Sig Dispense Refill  . diphenhydrAMINE (BENADRYL) 25 MG tablet Take 25 mg by mouth every 6 (six) hours as needed.    Marland Kitchen ibuprofen (ADVIL) 800 MG tablet Take 1 tablet (800 mg total) by mouth 3 (three) times daily. (Patient not taking: Reported on 03/20/2020) 21 tablet 0  . insulin glargine (LANTUS) 100 UNIT/ML injection Inject 30 Units into the skin daily. (Patient  not taking: Reported on 03/20/2020)    . levothyroxine (SYNTHROID, LEVOTHROID) 112 MCG tablet Take 112 mcg by mouth daily before breakfast. (Patient not taking: Reported on 03/20/2020)      Blood pressure 122/74, pulse (!) 110, temperature (!) 100.4 F (38 C), temperature source Oral, resp. rate 16, height 5\' 9"  (1.753 m), weight 88 kg, SpO2 98 %. Physical Exam: Constitutional: NAD; conversant; no deformities Eyes: Moist conjunctiva; no lid lag; anicteric; PERRL Neck: Trachea midline; no thyromegaly Lungs: Normal respiratory effort; CTAB CV: RRR; no palpable thrills; no pitting edema GI: Abd soft, nontender; no palpable hepatosplenomegaly GU: the R perineum is erythematous and tender to light palpation. There is ~8x3 cm of induration without scrotal involvement. No active drainage.  MSK: symmetrical; no clubbing/cyanosis Psychiatric: Appropriate affect; alert and oriented x3 Lymphatic: No palpable cervical or axillary lymphadenopathy  Results for orders placed or performed during the hospital encounter of 03/20/20 (from the past 48 hour(s))  Lipid panel     Status: Abnormal   Collection Time: 03/20/20  8:04 PM  Result Value Ref Range   Cholesterol 219 (H) 0 - 200 mg/dL   Triglycerides 03/22/20 086 mg/dL   HDL 36 (L) <578 mg/dL   Total CHOL/HDL Ratio 6.1 RATIO   VLDL 25 0 - 40 mg/dL   LDL Cholesterol >46 (H) 0 - 99 mg/dL    Comment:        Total Cholesterol/HDL:CHD Risk Coronary Heart Disease Risk Table  Men   Women  1/2 Average Risk   3.4   3.3  Average Risk       5.0   4.4  2 X Average Risk   9.6   7.1  3 X Average Risk  23.4   11.0        Use the calculated Patient Ratio above and the CHD Risk Table to determine the patient's CHD Risk.        ATP III CLASSIFICATION (LDL):  <100     mg/dL   Optimal  161-096  mg/dL   Near or Above                    Optimal  130-159  mg/dL   Borderline  045-409  mg/dL   High  >811     mg/dL   Very High Performed at [facility], [address] [place], [place] [address]   CBC     Status: Abnormal   Collection Time: 03/20/20  8:34 PM  Result Value Ref Range   WBC 12.9 (H) 4.0 - 10.5 K/uL   RBC 4.81 4.22 - 5.81 MIL/uL   Hemoglobin 13.9 13.0 - 17.0 g/dL   HCT 29.5 39 - 52 %   MCV 86.1 80.0 - 100.0 fL   MCH 28.9 26.0 - 34.0 pg   MCHC 33.6 30.0 - 36.0 g/dL   RDW 62.1 (L) 30.8 - 65.7 %   Platelets 257 150 - 400 K/uL   nRBC 0.0 0.0 - 0.2 %    Comment: Performed at [facility], [address] [place], [place] [address]  Troponin I (High Sensitivity)     Status: None   Collection Time: 03/20/20  8:34 PM  Result Value Ref Range   Troponin I (High Sensitivity) 3 <18 ng/L    Comment: (NOTE) Elevated high sensitivity troponin I (hsTnI) values and significant  changes across serial measurements may suggest ACS but many other  chronic and acute conditions are known to elevate hsTnI results.  Refer to the "Links" section for chest pain algorithms and additional  guidance. Performed at [facility], [address] [place], [place] [address]   Lactic acid, plasma     Status: None   Collection Time: 03/20/20  8:34 PM  Result Value Ref Range   Lactic Acid, Venous 1.8 0.5 - 1.9 mmol/L    Comment: Performed at [facility], [address], [place], [place] [address]  Comprehensive metabolic panel     Status: Abnormal   Collection Time: 03/20/20  8:34 PM  Result Value Ref Range   Sodium 134 (L) 135 - 145 mmol/L   Potassium 3.5 3.5 - 5.1 mmol/L   Chloride 97 (L) 98 - 111 mmol/L   CO2 25 22 - 32 mmol/L   Glucose, Bld 392 (H) 70 - 99 mg/dL    Comment: Glucose reference range applies only to samples taken after fasting for at least 8 hours.   BUN 14 6 - 20 mg/dL   Creatinine, Ser 4.01 0.61 - 1.24 mg/dL   Calcium 8.7 (L) 8.9 - 10.3 mg/dL   Total Protein 8.0 6.5 - 8.1 g/dL   Albumin 3.6 3.5 - 5.0 g/dL   AST 23 15 - 41 U/L   ALT 34 0 -  44 U/L   Alkaline Phosphatase 96 38 - 126 U/L   Total Bilirubin 1.2 0.3 - 1.2 mg/dL   GFR calc non Af Amer >  60 >60 mL/min   GFR calc Af Amer >60 >60 mL/min   Anion gap 12 5 - 15    Comment: Performed at [facility], [address], [place], [place] [address]  Hemoglobin A1c     Status: Abnormal   Collection Time: 03/20/20  8:34 PM  Result Value Ref Range   Hgb A1c MFr Bld 10.0 (H) 4.8 - 5.6 %    Comment: (NOTE) Pre diabetes:          5.7%-6.4%  Diabetes:              >6.4%  Glycemic control for   <7.0% adults with diabetes    Mean Plasma Glucose 240.3 mg/dL    Comment: Performed at [facility], [address] [place], [place] [address]  Urinalysis, Routine w reflex microscopic Urine, Clean Catch     Status: Abnormal   Collection Time: 03/20/20  8:42 PM  Result Value Ref Range   Color, Urine AMBER (A) YELLOW    Comment: BIOCHEMICALS MAY BE AFFECTED BY COLOR   APPearance CLEAR CLEAR   Specific Gravity, Urine 1.035 (H) 1.005 - 1.030   pH 5.0 5.0 - 8.0   Glucose, UA >=500 (A) NEGATIVE mg/dL   Hgb urine dipstick SMALL (A) NEGATIVE   Bilirubin Urine NEGATIVE NEGATIVE   Ketones, ur 20 (A) NEGATIVE mg/dL   Protein, ur 098 (A) NEGATIVE mg/dL   Nitrite NEGATIVE NEGATIVE   Leukocytes,Ua NEGATIVE NEGATIVE   RBC / HPF 0-5 0 - 5 RBC/hpf   WBC, UA 6-10 0 - 5 WBC/hpf   Bacteria, UA NONE SEEN NONE SEEN   Squamous Epithelial / LPF 0-5 0 - 5   Mucus PRESENT     Comment: Performed at [facility], [address], [place], [place] [address]  SARS Coronavirus 2 by RT PCR (hospital order, performed in [facility] hospital lab) Nasopharyngeal Nasopharyngeal Swab     Status: None   Collection Time: 03/20/20  8:54 PM   Specimen: Nasopharyngeal Swab  Result Value Ref Range   SARS Coronavirus 2 NEGATIVE NEGATIVE    Comment: (NOTE) SARS-CoV-2 target nucleic acids are NOT DETECTED.  The SARS-CoV-2 RNA is generally detectable in upper and  lower respiratory specimens during the acute phase of infection. The lowest concentration of SARS-CoV-2 viral copies this assay can detect is 250 copies / mL. A negative result does not preclude SARS-CoV-2 infection and should not be used as the sole basis for treatment or other patient management decisions.  A negative result may occur with improper specimen collection / handling, submission of specimen other than nasopharyngeal swab, presence of viral mutation(s) within the areas targeted by this assay, and inadequate number of viral copies (<250 copies / mL). A negative result must be combined with clinical observations, patient history, and epidemiological information.  Fact Sheet for Patients:   BoilerBrush.com.cy  Fact Sheet for Healthcare Providers: https://pope.com/  This test is not yet approved or  cleared by the [place] FDA and has been authorized for detection and/or diagnosis of SARS-CoV-2 by FDA under an Emergency Use Authorization (EUA).  This EUA will remain in effect (meaning this test can be used) for the duration of the COVID-19 declaration under Section 564(b)(1) of the Act, 21 U.S.C. section 360bbb-3(b)(1), unless the authorization is terminated or revoked sooner.  Performed at [facility], [address], [place], [place] [address]   CBG monitoring, ED     Status: Abnormal  Collection Time: 03/20/20 10:29 PM  Result Value Ref Range   Glucose-Capillary 349 (H) 70 - 99 mg/dL    Comment: Glucose reference range applies only to samples taken after fasting for at least 8 hours.  CBG monitoring, ED     Status: Abnormal   Collection Time: 03/21/20 12:22 AM  Result Value Ref Range   Glucose-Capillary 241 (H) 70 - 99 mg/dL    Comment: Glucose reference range applies only to samples taken after fasting for at least 8 hours.  TSH     Status: Abnormal   Collection Time: 03/21/20  1:12 AM   Result Value Ref Range   TSH 33.277 (H) 0.350 - 4.500 uIU/mL    Comment: Performed by a 3rd Generation assay with a functional sensitivity of <=0.01 uIU/mL. Performed at [facility], [address], [place], [place] [address]   CBC     Status: Abnormal   Collection Time: 03/21/20  3:38 AM  Result Value Ref Range   WBC 14.1 (H) 4.0 - 10.5 K/uL   RBC 4.37 4.22 - 5.81 MIL/uL   Hemoglobin 12.8 (L) 13.0 - 17.0 g/dL   HCT 52.7 (L) 39 - 52 %   MCV 84.9 80.0 - 100.0 fL   MCH 29.3 26.0 - 34.0 pg   MCHC 34.5 30.0 - 36.0 g/dL   RDW 78.2 (L) 42.3 - 53.6 %   Platelets 262 150 - 400 K/uL   nRBC 0.0 0.0 - 0.2 %    Comment: Performed at [facility], [address], [place], [place] [address]  Basic metabolic panel     Status: Abnormal   Collection Time: 03/21/20  6:16 AM  Result Value Ref Range   Sodium 138 135 - 145 mmol/L   Potassium 3.2 (L) 3.5 - 5.1 mmol/L   Chloride 100 98 - 111 mmol/L   CO2 23 22 - 32 mmol/L   Glucose, Bld 213 (H) 70 - 99 mg/dL    Comment: Glucose reference range applies only to samples taken after fasting for at least 8 hours.   BUN 14 6 - 20 mg/dL   Creatinine, Ser 5.40 0.61 - 1.24 mg/dL   Calcium 8.8 (L) 8.9 - 10.3 mg/dL   GFR calc non Af Amer >60 >60 mL/min   GFR calc Af Amer >60 >60 mL/min   Anion gap 15 5 - 15    Comment: Performed at [facility], [address], [place], [place] [address]  Troponin I (High Sensitivity)     Status: None   Collection Time: 03/21/20  6:16 AM  Result Value Ref Range   Troponin I (High Sensitivity) 3 <18 ng/L    Comment: (NOTE) Elevated high sensitivity troponin I (hsTnI) values and significant  changes across serial measurements may suggest ACS but many other  chronic and acute conditions are known to elevate hsTnI results.  Refer to the "Links" section for chest pain algorithms and additional  guidance. Performed at [facility], [address], [place], [place] [address]    CT Angio Chest PE W and/or Wo Contrast  Result Date: 03/20/2020 CLINICAL DATA:  Chest pain and shortness of breath EXAM: CT ANGIOGRAPHY CHEST WITH CONTRAST TECHNIQUE: Multidetector CT imaging of the chest was performed using the standard protocol during bolus administration of intravenous contrast. Multiplanar CT image reconstructions and MIPs were obtained to evaluate the vascular anatomy. CONTRAST:  OMNIPAQUE IOHEXOL 350 MG/ML SOLN COMPARISON:  None. FINDINGS: Cardiovascular: Contrast  injection is sufficient to demonstrate satisfactory opacification of the pulmonary arteries to the segmental level. There is no pulmonary embolus or evidence of right heart strain. The size of the main pulmonary artery is normal. Heart size is normal, with no pericardial effusion. The course and caliber of the aorta are normal. There is no atherosclerotic calcification. Opacification decreased due to pulmonary arterial phase contrast bolus timing. Mediastinum/Nodes: No mediastinal, hilar or axillary lymphadenopathy. Normal visualized thyroid. Thoracic esophageal course is normal. Lungs/Pleura: Airways are patent. Bibasilar atelectasis. No pleural effusion, lobar consolidation, pneumothorax or pulmonary infarction. Upper Abdomen: Contrast bolus timing is not optimized for evaluation of the abdominal organs. The visualized portions of the organs of the upper abdomen are normal. Musculoskeletal: No chest wall abnormality. No bony spinal canal stenosis. Review of the MIP images confirms the above findings. findings IMPRESSION: No pulmonary embolus or other acute thoracic abnormality. Electronically Signed   By: Deatra Robinson M.D.   On: 03/20/2020 23:04   CT ABDOMEN PELVIS W CONTRAST  Result Date: 03/20/2020 CLINICAL DATA:  Perineal swelling EXAM: CT ABDOMEN AND PELVIS WITH CONTRAST TECHNIQUE: Multidetector CT imaging of the abdomen and pelvis was performed using the standard protocol  following bolus administration of intravenous contrast. CONTRAST:  OMNIPAQUE IOHEXOL 350 MG/ML SOLN COMPARISON:  01/28/2017 FINDINGS: Hepatobiliary: Normal hepatic contours and density. No visible biliary dilatation. Normal gallbladder. Pancreas: Normal contours without ductal dilatation. No peripancreatic fluid collection. Spleen: Normal. Adrenals/Urinary Tract: --Adrenal glands: Normal. --Right kidney/ureter: No hydronephrosis or perinephric stranding. No nephrolithiasis. No obstructing ureteral stones. --Left kidney/ureter: No hydronephrosis or perinephric stranding. No nephrolithiasis. No obstructing ureteral stones. --Urinary bladder: Unremarkable. Stomach/Bowel: --Stomach/Duodenum: No hiatal hernia or other gastric abnormality. Normal duodenal course and caliber. --Small bowel: No dilatation or inflammation. --Colon: No focal abnormality. --Appendix: Normal. Vascular/Lymphatic: Normal course and caliber of the major abdominal vessels. No abdominal or pelvic lymphadenopathy. Reproductive: Headache there is a gas and fluid containing right perineal collection that measures 7.2 x 5.1 x 2.0 cm. Musculoskeletal. No bony spinal canal stenosis or focal osseous abnormality. Other: None. IMPRESSION: Right perineal abscess measuring up to 7.2 x 5.1 x 2.0 cm. Electronically Signed   By: Deatra Robinson M.D.   On: 03/20/2020 23:51   DG Chest Port 1 View  Result Date: 03/20/2020 CLINICAL DATA:  Shortness of breath EXAM: PORTABLE CHEST 1 VIEW COMPARISON:  07/03/2012 FINDINGS: The heart size and mediastinal contours are within normal limits. Both lungs are clear. The visualized skeletal structures are unremarkable. IMPRESSION: No active disease. Electronically Signed   By: Alcide Clever M.D.   On: 03/20/2020 20:26   Assessment/Plan DM2 with hyperglycemia - A1c 10% Hypothyroidism   Chest pain - troponin negative, EKG sinus tach, chest pain resolved, 2D echo pending   R perineal abscess  - low grade fever  (100.4), HR 110 bpm, WBC 14,000 - cont IV abx (Vanc, Zosyn)  - recommend Incision and drainage of the abscess in the OR today with Dr. Ezzard Standing   FEN: NPO, IVF ID: Vanc, Zosyn [id number] >>  VTE: SCDs, hold Lovenox for OR and plan to resume post-operatively  Foley: none  Dispo: med-surg, OR today for I&D R perineal abscess  Kevin Oneill, [facility] Surgery Please see Amion for pager number during day hours 7:00am-4:30pm 03/21/2020, 8:04 AM   Agree with above. He ran out of money and therefore ran out of medicines. He has 10 children, but he says that they are of no help. He had a job until  end of August, but it shut down because of Covid.  He has had one of 2 shots for Covid.  For I&D of right groin abscess later today.  Ovidio Kinavid Kevin Jeancharles, MD, [facility] [place] [facility] Office phone:  [phone]

## 2020-03-21 NOTE — Anesthesia Procedure Notes (Signed)
Procedure Name: LMA Insertion Date/Time: 03/21/2020 2:36 PM Performed by: Vanessa Hardin, CRNA Pre-anesthesia Checklist: Emergency Drugs available, Patient identified, Suction available and Patient being monitored Patient Re-evaluated:Patient Re-evaluated prior to induction Oxygen Delivery Method: Circle system utilized Preoxygenation: Pre-oxygenation with 100% oxygen Induction Type: IV induction Ventilation: Mask ventilation without difficulty LMA: LMA inserted LMA Size: 4.0 Number of attempts: 1 Placement Confirmation: positive ETCO2 and breath sounds checked- equal and bilateral Tube secured with: Tape Dental Injury: Teeth and Oropharynx as per pre-operative assessment
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
## 2020-03-21 NOTE — Anesthesia Postprocedure Evaluation (Signed)
Anesthesia Post Note  Patient: Kevin Oneill  Procedure(s) Performed: IRRIGATION AND DEBRIDEMENT IRIGHT GROIN ABSCESS (Right )     Patient location during evaluation: PACU Anesthesia Type: General Level of consciousness: awake and alert Pain management: pain level controlled Vital Signs Assessment: post-procedure vital signs reviewed and stable Respiratory status: spontaneous breathing, nonlabored ventilation, respiratory function stable and patient connected to nasal cannula oxygen Cardiovascular status: blood pressure returned to baseline and stable Postop Assessment: no apparent nausea or vomiting Anesthetic complications: no   No complications documented.  Last Vitals:  Vitals:   03/21/20 1600 03/21/20 1612  BP: (!) 97/59 (!) 94/56  Pulse: 88 92  Resp: 12 18  Temp: 36.9 C 36.7 C  SpO2: 99% 99%    Last Pain:  Vitals:   03/21/20 1612  TempSrc: Oral  PainSc: 0-No pain                 Beryle Lathe

## 2020-03-21 NOTE — Op Note (Signed)
03/21/2020  3:11 PM  PATIENT:  Kevin Oneill, 54 y.o., male, MRN: [id number]  PREOP DIAGNOSIS:  right groin abcess  POSTOP DIAGNOSIS:   Right groin/perieal abscess  PROCEDURE:   Procedure(s):   IRRIGATION AND DEBRIDEMENT IRIGHT GROIN ABSCESS  SURGEON:   Ovidio Kin, M.D.  ASSISTANT:   Jennette Kettle, [facility] resident  ANESTHESIA:   general  Anesthesiologist: Beryle Lathe, MD; Heather Roberts, MD CRNA: Vanessa Major, CRNA  General  EBL:  minimal  ml  BLOOD ADMINISTERED: none  DRAINS: none   LOCAL MEDICATIONS USED:   10 cc of 1/4% marcaine  SPECIMEN:   Cultures obtained  COUNTS CORRECT:  YES  INDICATIONS FOR PROCEDURE:  Kevin Oneill is a 54 y.o. (DOB: 1966-06-28) AA male whose primary care physician is Patient, No Pcp Per and comes for I&D of right groin/perineal abscess.  He has uncontrolled DM.  Patient was taken   The indications and risks of the surgery were explained to the patient.  The risks include, but are not limited to, infection, bleeding, and nerve injury.  PROCEDURE: Patient was taken to room #[facility] at [facility].  He was placed in the lithotomy position.  His perineum was prepped with Betadine solution.  A timeout was held the surgical checklist run.  He had a draining sinus in his right groin/perineal area.  This area was opened widely into necrotic tissue.  Aerobic and anaerobic cultures were obtained.  I made an incision approximately 3 x 9 cm.  I debrided the necrotic tissue.  I irrigated the wound with 500 cc of saline.  I packed the wound with a saline soaked Kerlix.  And the wound was sterilely dressed.  The patient was sent to the recovery in good condition.  Ovidio Kin, MD, [facility] Office phone:  [phone]
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
## 2020-03-21 NOTE — Anesthesia Preprocedure Evaluation (Addendum)
Anesthesia Evaluation  Patient identified by MRN, date of birth, ID band Patient awake    Reviewed: Allergy & Precautions, NPO status , Patient's Chart, lab work & pertinent test results  Airway Mallampati: II  TM Distance: >3 FB Neck ROM: Full    Dental  (+) Poor Dentition, Dental Advisory Given, Loose   Pulmonary Current Smoker, former smoker,    Pulmonary exam normal        Cardiovascular hypertension, Pt. on home beta blockers Normal cardiovascular exam  IMPRESSIONS   1. Left ventricular ejection fraction, by estimation, is 60 to 65%. The left ventricle has normal function. The left ventricle has no regional wall motion abnormalities. Left ventricular diastolic function could not be evaluated. 2. Right ventricular systolic function is normal. The right ventricular size is normal. Tricuspid regurgitation signal is inadequate for assessing PA pressure. 3. The mitral valve is normal in structure. Trivial mitral valve regurgitation. No evidence of mitral stenosis. 4. The aortic valve is normal in structure. Aortic valve regurgitation is not visualized. No aortic stenosis is present. 5. The inferior vena cava is normal in size with <50% respiratory variability, suggesting right atrial pressure of 8 mmHg.    Neuro/Psych negative neurological ROS  negative psych ROS   GI/Hepatic negative GI ROS, Neg liver ROS,   Endo/Other  diabetesHypothyroidism   Renal/GU negative Renal ROS  negative genitourinary   Musculoskeletal negative musculoskeletal ROS (+)   Abdominal   Peds negative pediatric ROS (+)  Hematology negative hematology ROS (+)   Anesthesia Other Findings   Reproductive/Obstetrics negative OB ROS                            Anesthesia Physical Anesthesia Plan  ASA: III  Anesthesia Plan: General   Post-op Pain Management:    Induction: Intravenous  PONV Risk Score and  Plan: 2 and Ondansetron and Midazolam  Airway Management Planned: LMA  Additional Equipment:   Intra-op Plan:   Post-operative Plan: Extubation in OR  Informed Consent: I have reviewed the patients History and Physical, chart, labs and discussed the procedure including the risks, benefits and alternatives for the proposed anesthesia with the patient or authorized representative who has indicated his/her understanding and acceptance.     Dental advisory given  Plan Discussed with: Anesthesiologist and CRNA  Anesthesia Plan Comments:         Anesthesia Quick Evaluation

## 2020-03-21 NOTE — Transfer of Care (Signed)
Immediate Anesthesia Transfer of Care Note  Patient: Kevin Oneill  Procedure(s) Performed: IRRIGATION AND DEBRIDEMENT IRIGHT GROIN ABSCESS (Right )  Patient Location: PACU  Anesthesia Type:General  Level of Consciousness: drowsy  Airway & Oxygen Therapy: Patient Spontanous Breathing and Patient connected to face mask  Post-op Assessment: Report given to RN and Post -op Vital signs reviewed and stable  Post vital signs: Reviewed and stable  Last Vitals:  Vitals Value Taken Time  BP 97/59 03/21/20 1517  Temp    Pulse 94 03/21/20 1520  Resp 12 03/21/20 1520  SpO2 99 % 03/21/20 1520  Vitals shown include unvalidated device data.  Last Pain:  Vitals:   03/21/20 1323  TempSrc:   PainSc: 0-No pain      Patients Stated Pain Goal: 1 (03/21/20 0834)  Complications: No complications documented.

## 2020-03-22 ENCOUNTER — Encounter (HOSPITAL_COMMUNITY): Payer: Self-pay | Admitting: Surgery

## 2020-03-22 LAB — GLUCOSE, CAPILLARY
Glucose-Capillary: 274 mg/dL — ABNORMAL HIGH (ref 70–99)
Glucose-Capillary: 225 mg/dL — ABNORMAL HIGH (ref 70–99)
Glucose-Capillary: 226 mg/dL — ABNORMAL HIGH (ref 70–99)
Glucose-Capillary: 189 mg/dL — ABNORMAL HIGH (ref 70–99)
Glucose-Capillary: 182 mg/dL — ABNORMAL HIGH (ref 70–99)

## 2020-03-22 LAB — CBC WITH DIFFERENTIAL/PLATELET
Abs Immature Granulocytes: 0 10*3/uL (ref 0.00–0.07)
Band Neutrophils: 2 %
Basophils Absolute: 0 10*3/uL (ref 0.0–0.1)
Basophils Relative: 0 %
Eosinophils Absolute: 0 10*3/uL (ref 0.0–0.5)
Eosinophils Relative: 0 %
HCT: 34.6 % — ABNORMAL LOW (ref 39.0–52.0)
Hemoglobin: 11.5 g/dL — ABNORMAL LOW (ref 13.0–17.0)
Lymphocytes Relative: 7 %
Lymphs Abs: 1.2 10*3/uL (ref 0.7–4.0)
MCH: 28.5 pg (ref 26.0–34.0)
MCHC: 33.2 g/dL (ref 30.0–36.0)
MCV: 85.9 fL (ref 80.0–100.0)
Monocytes Absolute: 0.3 10*3/uL (ref 0.1–1.0)
Monocytes Relative: 2 %
Neutro Abs: 15.1 10*3/uL — ABNORMAL HIGH (ref 1.7–7.7)
Neutrophils Relative %: 89 %
Platelets: 264 10*3/uL (ref 150–400)
RBC: 4.03 MIL/uL — ABNORMAL LOW (ref 4.22–5.81)
RDW: 11.5 % (ref 11.5–15.5)
WBC: 16.6 10*3/uL — ABNORMAL HIGH (ref 4.0–10.5)
nRBC: 0 % (ref 0.0–0.2)

## 2020-03-22 LAB — BASIC METABOLIC PANEL
Anion gap: 11 (ref 5–15)
BUN: 19 mg/dL (ref 6–20)
CO2: 26 mmol/L (ref 22–32)
Calcium: 8.6 mg/dL — ABNORMAL LOW (ref 8.9–10.3)
Chloride: 102 mmol/L (ref 98–111)
Creatinine, Ser: 1.4 mg/dL — ABNORMAL HIGH (ref 0.61–1.24)
GFR calc Af Amer: >60 mL/min (ref >60)
GFR calc non Af Amer: 57 mL/min — ABNORMAL LOW (ref >60)
Glucose, Bld: 267 mg/dL — ABNORMAL HIGH (ref 70–99)
Potassium: 3.4 mmol/L — ABNORMAL LOW (ref 3.5–5.1)
Sodium: 139 mmol/L (ref 135–145)

## 2020-03-22 LAB — URINE CULTURE: Culture: <10000 — AB

## 2020-03-22 LAB — MAGNESIUM: Magnesium: 2 mg/dL (ref 1.7–2.4)

## 2020-03-22 LAB — TROPONIN I (HIGH SENSITIVITY): Troponin I (High Sensitivity): <2 ng/L (ref <18)

## 2020-03-22 MED ORDER — INSULIN ASPART 100 UNIT/ML ~~LOC~~ SOLN
4.0000 [IU] | Freq: Three times a day (TID) | SUBCUTANEOUS | Status: DC
  Administered 2020-03-22 (×2): 4 [IU] via SUBCUTANEOUS

## 2020-03-22 MED ORDER — METRONIDAZOLE IN NACL 5-0.79 MG/ML-% IV SOLN
500.0000 mg | Freq: Three times a day (TID) | INTRAVENOUS | Status: DC
  Administered 2020-03-22 – 2020-03-23 (×4): 500 mg via INTRAVENOUS
  Filled 2020-03-22 (×4): qty 100

## 2020-03-22 MED ORDER — SODIUM CHLORIDE 0.9 % IV SOLN
2.0000 g | INTRAVENOUS | Status: DC
  Administered 2020-03-22 – 2020-03-23 (×2): 2 g via INTRAVENOUS
  Filled 2020-03-22 (×2): qty 2

## 2020-03-22 MED ORDER — ENOXAPARIN SODIUM 40 MG/0.4ML ~~LOC~~ SOLN
40.0000 mg | SUBCUTANEOUS | Status: DC
  Administered 2020-03-22 – 2020-03-23 (×2): 40 mg via SUBCUTANEOUS
  Filled 2020-03-22 (×2): qty 0.4

## 2020-03-22 MED ORDER — INSULIN ASPART 100 UNIT/ML ~~LOC~~ SOLN
6.0000 [IU] | Freq: Three times a day (TID) | SUBCUTANEOUS | Status: DC
  Administered 2020-03-23 (×2): 6 [IU] via SUBCUTANEOUS

## 2020-03-22 MED ORDER — POTASSIUM CHLORIDE 20 MEQ PO PACK
40.0000 meq | PACK | Freq: Once | ORAL | Status: AC
  Administered 2020-03-22: 40 meq via ORAL
  Filled 2020-03-22: qty 2

## 2020-03-22 MED ORDER — MORPHINE SULFATE (PF) 2 MG/ML IV SOLN
2.0000 mg | INTRAVENOUS | Status: DC | PRN
  Administered 2020-03-22 – 2020-03-23 (×2): 2 mg via INTRAVENOUS
  Filled 2020-03-22 (×2): qty 1

## 2020-03-22 MED ORDER — ACETAMINOPHEN 500 MG PO TABS
1000.0000 mg | ORAL_TABLET | Freq: Four times a day (QID) | ORAL | Status: DC
  Administered 2020-03-22 – 2020-03-23 (×4): 1000 mg via ORAL
  Filled 2020-03-22 (×5): qty 2

## 2020-03-22 MED ORDER — PANTOPRAZOLE SODIUM 40 MG PO TBEC
40.0000 mg | DELAYED_RELEASE_TABLET | Freq: Every day | ORAL | Status: DC
  Administered 2020-03-22 – 2020-03-23 (×2): 40 mg via ORAL
  Filled 2020-03-22 (×2): qty 1

## 2020-03-22 MED ORDER — OXYCODONE HCL 5 MG PO TABS
5.0000 mg | ORAL_TABLET | ORAL | Status: DC | PRN
  Administered 2020-03-22 – 2020-03-23 (×4): 10 mg via ORAL
  Filled 2020-03-22 (×4): qty 2

## 2020-03-22 MED ORDER — INSULIN GLARGINE 100 UNIT/ML ~~LOC~~ SOLN
4.0000 [IU] | Freq: Once | SUBCUTANEOUS | Status: AC
  Administered 2020-03-22: 4 [IU] via SUBCUTANEOUS
  Filled 2020-03-22: qty 0.04

## 2020-03-22 MED ORDER — ALUM & MAG HYDROXIDE-SIMETH 200-200-20 MG/5ML PO SUSP
30.0000 mL | ORAL | Status: DC | PRN
  Administered 2020-03-22: 30 mL via ORAL
  Filled 2020-03-22: qty 30

## 2020-03-22 MED ORDER — INSULIN GLARGINE 100 UNIT/ML ~~LOC~~ SOLN
34.0000 [IU] | Freq: Every day | SUBCUTANEOUS | Status: DC
  Administered 2020-03-22 – 2020-03-23 (×2): 34 [IU] via SUBCUTANEOUS
  Filled 2020-03-22 (×2): qty 0.34

## 2020-03-22 NOTE — Progress Notes (Signed)
Inpatient Diabetes Program Recommendations  AACE/ADA: New Consensus Statement on Inpatient Glycemic Control (2015)  Target Ranges:  Prepandial:   less than 140 mg/dL      Peak postprandial:   less than 180 mg/dL (1-2 hours)      Critically ill patients:  140 - 180 mg/dL   Lab Results  Component Value Date   GLUCAP 226 (H) 03/22/2020   HGBA1C 10.0 (H) 03/20/2020    Review of Glycemic Control  Diabetes history: DM2 Outpatient Diabetes medications: None in past 2 years Current orders for Inpatient glycemic control: Lantus 34 units QD, Novolog 0-20 units Q6H + 4 units tidwc  HgbA1C - 10% CBGs today - 274, 225, 226 mg/dL Received Decadron 5 mg during surgery on 9/20. Will likely need more meal coverage insulin.  Inpatient Diabetes Program Recommendations:     Increase Novolog to 6 units tidwc.  Spoke with pt this morning regarding his HgbA1C of 10%. Pt states he was on Lantus and metformin 2 years ago when he had insurance. Lost job and insurance and has not taken insulin since then. No PCP visits for 2 years. Tries to eat healthy and exercises. Will need PCP to manage his diabetes.   Has appt with [place] on 04/13/20 at 9:50 am. Will receive [place] letter at discharge for new meds.  Will need prescription for Lantus Solostar and Novolog Flexpen pens, insulin pen needles (# [id number]) and blood glucose meter kit (# [id number]). Discussed hypoglycemia s/s and treatment. Instructed to check blood sugars 3-4x/day and record in logbook. Take logbook to appt at [facility].  Pt appears to be motivated to control blood sugars and is appreciative of assistance with PCP and meds.   Thank you. Lorenda Peck, RD, LDN, CDE Inpatient Diabetes Coordinator [phone]

## 2020-03-22 NOTE — Progress Notes (Signed)
1 Day Post-Op  Subjective: Doing ok.  No new complaints, but painful to try and get up and mobilize to the bathroom.  ROS: See above, otherwise other systems negative  Objective: Vital signs in last 24 hours: Temp:  [97.7 F (36.5 C)-99.1 F (37.3 C)] 97.8 F (36.6 C) (09/21 0953) Pulse Rate:  [83-111] 90 (09/21 0953) Resp:  [10-18] 16 (09/21 0953) BP: (93-123)/(56-77) 121/64 (09/21 0953) SpO2:  [95 %-100 %] 100 % (09/21 0953) Weight:  [88 kg] 88 kg (09/20 1323) Last BM Date: 03/20/20  Intake/Output from previous day: 09/20 0701 - 09/21 0700 In: 3047.3 [P.O.:360; I.V.:2017; IV Piggyback:670.4] Out: 1105 [Urine:1100; Blood:5] Intake/Output this shift: Total I/O In: 514 [P.O.:120; I.V.:369.9; IV Piggyback:24.2] Out: 700 [Urine:700]  PE: GU: normal genitalia.  Perineal/groin wound is clean.  Packing removed.  No further purulent drainage noted.  Minimal induration and no other erythema noted.  Packing changed and replaced.    Lab Results:  Recent Labs    03/21/20 0338 03/22/20 0322  WBC 14.1* 16.6*  HGB 12.8* 11.5*  HCT 37.1* 34.6*  PLT 262 264   BMET Recent Labs    03/21/20 0616 03/22/20 0322  NA 138 139  K 3.2* 3.4*  CL 100 102  CO2 23 26  GLUCOSE 213* 267*  BUN 14 19  CREATININE 1.02 1.40*  CALCIUM 8.8* 8.6*   PT/INR No results for input(s): LABPROT, INR in the last 72 hours. CMP     Component Value Date/Time   NA 139 03/22/2020 0322   K 3.4 (L) 03/22/2020 0322   CL 102 03/22/2020 0322   CO2 26 03/22/2020 0322   GLUCOSE 267 (H) 03/22/2020 0322   BUN 19 03/22/2020 0322   CREATININE 1.40 (H) 03/22/2020 0322   CALCIUM 8.6 (L) 03/22/2020 0322   PROT 8.0 03/20/2020 2034   ALBUMIN 3.6 03/20/2020 2034   AST 23 03/20/2020 2034   ALT 34 03/20/2020 2034   ALKPHOS 96 03/20/2020 2034   BILITOT 1.2 03/20/2020 2034   GFRNONAA 57 (L) 03/22/2020 0322   GFRAA >60 03/22/2020 0322   Lipase     Component Value Date/Time   LIPASE 37 01/28/2017 0948        Studies/Results: CT Angio Chest PE W and/or Wo Contrast  Result Date: 03/20/2020 CLINICAL DATA:  Chest pain and shortness of breath EXAM: CT ANGIOGRAPHY CHEST WITH CONTRAST TECHNIQUE: Multidetector CT imaging of the chest was performed using the standard protocol during bolus administration of intravenous contrast. Multiplanar CT image reconstructions and MIPs were obtained to evaluate the vascular anatomy. CONTRAST:  OMNIPAQUE IOHEXOL 350 MG/ML SOLN COMPARISON:  None. FINDINGS: Cardiovascular: Contrast injection is sufficient to demonstrate satisfactory opacification of the pulmonary arteries to the segmental level. There is no pulmonary embolus or evidence of right heart strain. The size of the main pulmonary artery is normal. Heart size is normal, with no pericardial effusion. The course and caliber of the aorta are normal. There is no atherosclerotic calcification. Opacification decreased due to pulmonary arterial phase contrast bolus timing. Mediastinum/Nodes: No mediastinal, hilar or axillary lymphadenopathy. Normal visualized thyroid. Thoracic esophageal course is normal. Lungs/Pleura: Airways are patent. Bibasilar atelectasis. No pleural effusion, lobar consolidation, pneumothorax or pulmonary infarction. Upper Abdomen: Contrast bolus timing is not optimized for evaluation of the abdominal organs. The visualized portions of the organs of the upper abdomen are normal. Musculoskeletal: No chest wall abnormality. No bony spinal canal stenosis. Review of the MIP images confirms the above findings. findings IMPRESSION:  No pulmonary embolus or other acute thoracic abnormality. Electronically Signed   By: Deatra Robinson M.D.   On: 03/20/2020 23:04   CT ABDOMEN PELVIS W CONTRAST  Result Date: 03/20/2020 CLINICAL DATA:  Perineal swelling EXAM: CT ABDOMEN AND PELVIS WITH CONTRAST TECHNIQUE: Multidetector CT imaging of the abdomen and pelvis was performed using the standard protocol following  bolus administration of intravenous contrast. CONTRAST:  OMNIPAQUE IOHEXOL 350 MG/ML SOLN COMPARISON:  01/28/2017 FINDINGS: Hepatobiliary: Normal hepatic contours and density. No visible biliary dilatation. Normal gallbladder. Pancreas: Normal contours without ductal dilatation. No peripancreatic fluid collection. Spleen: Normal. Adrenals/Urinary Tract: --Adrenal glands: Normal. --Right kidney/ureter: No hydronephrosis or perinephric stranding. No nephrolithiasis. No obstructing ureteral stones. --Left kidney/ureter: No hydronephrosis or perinephric stranding. No nephrolithiasis. No obstructing ureteral stones. --Urinary bladder: Unremarkable. Stomach/Bowel: --Stomach/Duodenum: No hiatal hernia or other gastric abnormality. Normal duodenal course and caliber. --Small bowel: No dilatation or inflammation. --Colon: No focal abnormality. --Appendix: Normal. Vascular/Lymphatic: Normal course and caliber of the major abdominal vessels. No abdominal or pelvic lymphadenopathy. Reproductive: Headache there is a gas and fluid containing right perineal collection that measures 7.2 x 5.1 x 2.0 cm. Musculoskeletal. No bony spinal canal stenosis or focal osseous abnormality. Other: None. IMPRESSION: Right perineal abscess measuring up to 7.2 x 5.1 x 2.0 cm. Electronically Signed   By: Deatra Robinson M.D.   On: 03/20/2020 23:51   DG Chest Port 1 View  Result Date: 03/20/2020 CLINICAL DATA:  Shortness of breath EXAM: PORTABLE CHEST 1 VIEW COMPARISON:  07/03/2012 FINDINGS: The heart size and mediastinal contours are within normal limits. Both lungs are clear. The visualized skeletal structures are unremarkable. IMPRESSION: No active disease. Electronically Signed   By: Alcide Clever M.D.   On: 03/20/2020 20:26   ECHOCARDIOGRAM COMPLETE  Result Date: 03/21/2020    ECHOCARDIOGRAM REPORT   Patient Name:   TYREE FLUHARTY Date of Exam: 03/21/2020 Medical Rec #:  [id number]       Height:       69.0 in Accession #:     [id number]      Weight:       194.0 lb Date of Birth:  September 14, 1965       BSA:          2.039 m Patient Age:    54 years        BP:           112/67 mmHg Patient Gender: M               HR:           94 bpm. Exam Location:  Inpatient Procedure: 2D Echo Indications:    Chest Pain  History:        Patient has no prior history of Echocardiogram examinations.                 Risk Factors:Diabetes and Current Smoker.  Sonographer:    Thurman Coyer RDCS (AE) Referring Phys: [id number] DANIEL V THOMPSON IMPRESSIONS  1. Left ventricular ejection fraction, by estimation, is 60 to 65%. The left ventricle has normal function. The left ventricle has no regional wall motion abnormalities. Left ventricular diastolic function could not be evaluated.  2. Right ventricular systolic function is normal. The right ventricular size is normal. Tricuspid regurgitation signal is inadequate for assessing PA pressure.  3. The mitral valve is normal in structure. Trivial mitral valve regurgitation. No evidence of mitral stenosis.  4. The aortic valve is normal in structure.  Aortic valve regurgitation is not visualized. No aortic stenosis is present.  5. The inferior vena cava is normal in size with <50% respiratory variability, suggesting right atrial pressure of 8 mmHg. FINDINGS  Left Ventricle: Left ventricular ejection fraction, by estimation, is 60 to 65%. The left ventricle has normal function. The left ventricle has no regional wall motion abnormalities. The left ventricular internal cavity size was normal in size. There is  no left ventricular hypertrophy. Left ventricular diastolic function could not be evaluated. Right Ventricle: The right ventricular size is normal. No increase in right ventricular wall thickness. Right ventricular systolic function is normal. Tricuspid regurgitation signal is inadequate for assessing PA pressure. Left Atrium: Left atrial size was normal in size. Right Atrium: Right atrial size was normal in size.  Pericardium: There is no evidence of pericardial effusion. Mitral Valve: The mitral valve is normal in structure. Trivial mitral valve regurgitation. No evidence of mitral valve stenosis. Tricuspid Valve: The tricuspid valve is normal in structure. Tricuspid valve regurgitation is trivial. No evidence of tricuspid stenosis. Aortic Valve: The aortic valve is normal in structure. Aortic valve regurgitation is not visualized. No aortic stenosis is present. Pulmonic Valve: The pulmonic valve was normal in structure. Pulmonic valve regurgitation is not visualized. No evidence of pulmonic stenosis. Aorta: The aortic root is normal in size and structure. Venous: The inferior vena cava is normal in size with less than 50% respiratory variability, suggesting right atrial pressure of 8 mmHg. IAS/Shunts: No atrial level shunt detected by color flow Doppler.  LEFT VENTRICLE PLAX 2D LVIDd:         4.30 cm  Diastology LVIDs:         2.95 cm  LV e' lateral: 9.39 cm/s LV PW:         0.90 cm LV IVS:        0.80 cm LVOT diam:     2.10 cm LV SV:         70 LV SV Index:   34 LVOT Area:     3.46 cm  RIGHT VENTRICLE RV S prime:     10.60 cm/s TAPSE (M-mode): 2.4 cm LEFT ATRIUM             Index       RIGHT ATRIUM           Index LA diam:        3.40 cm 1.67 cm/m  RA Area:     14.70 cm LA Vol (A2C):   38.2 ml 18.73 ml/m RA Volume:   39.00 ml  19.12 ml/m LA Vol (A4C):   34.5 ml 16.92 ml/m LA Biplane Vol: 36.3 ml 17.80 ml/m  AORTIC VALVE LVOT Vmax:   116.00 cm/s LVOT Vmean:  75.200 cm/s LVOT VTI:    0.202 m  AORTA Ao Root diam: 3.00 cm Ao Asc diam:  3.00 cm  SHUNTS Systemic VTI:  0.20 m Systemic Diam: 2.10 cm Armanda Magicraci Turner MD Electronically signed by Armanda Magicraci Turner MD Signature Date/Time: 03/21/2020/1:13:09 PM    Final     Anti-infectives: Anti-infectives (From admission, onward)   Start     Dose/Rate Route Frequency Ordered Stop   03/22/20 1200  cefTRIAXone (ROCEPHIN) 2 g in sodium chloride 0.9 % 100 mL IVPB        2 g 200 mL/hr  over 30 Minutes Intravenous Every 24 hours 03/22/20 0830     03/22/20 1200  metroNIDAZOLE (FLAGYL) IVPB 500 mg  500 mg 100 mL/hr over 60 Minutes Intravenous Every 8 hours 03/22/20 0830     03/21/20 0400  piperacillin-tazobactam (ZOSYN) IVPB 3.375 g  Status:  Discontinued        3.375 g 12.5 mL/hr over 240 Minutes Intravenous Every 8 hours 03/21/20 0055 03/22/20 0830   03/21/20 0400  vancomycin (VANCOCIN) IVPB 1000 mg/200 mL premix        1,000 mg 200 mL/hr over 60 Minutes Intravenous Every 12 hours 03/21/20 0055     03/20/20 2100  piperacillin-tazobactam (ZOSYN) IVPB 3.375 g        3.375 g 100 mL/hr over 30 Minutes Intravenous  Once 03/20/20 2057 03/20/20 2229   03/20/20 2100  vancomycin (VANCOCIN) IVPB 1000 mg/200 mL premix        1,000 mg 200 mL/hr over 60 Minutes Intravenous  Once 03/20/20 2057 03/21/20 0023   03/20/20 0000  clindamycin (CLEOCIN) 300 MG capsule        300 mg Oral 3 times daily 03/20/20 2324         Assessment/Plan  Uncontrolled DM - per medicine  hypothyroidism  HLD  POD 1, s/p I&D of right groin/perineal abscess, Dr. Ezzard Standing 9/20 -NS WD dressing changes BID -wound is overall very clean -WBC up to 16K post op which is somewhat expected, follow -cultures pending but shows gram + and Gram - bacteria -cont abx therapy for now    FEN - carb mod diet VTE - SCDs, would like to start lovenox, will ask Medicine ID - Rocephin/Flagyl/Vanc   LOS: 1 day    Letha Cape , [facility] Surgery 03/22/2020, 10:38 AM Please see Amion for pager number during day hours 7:00am-4:30pm or 7:00am -11:30am on weekends

## 2020-03-22 NOTE — Progress Notes (Addendum)
PROGRESS NOTE    Kevin Oneill  ZHY:[id number] DOB: 1965-11-09 DOA: 03/20/2020 PCP: Patient, No Pcp Per    Chief Complaint  Patient presents with  . Groin Swelling    Brief Narrative:  HPI per Dr. Rockie Neighbours Cafaro  is a 54 y.o. African-American male with a known history of type diabetes mellitus and hypothyroidism, presented to the emergency room with acute onset of right groin swelling with associated tenderness, warmth, erythema and pain since Friday.  He has not been taking his diabetic medications including insulin and Metformin for 2 years.  He admitted to fever and chills.  He denied any nausea or vomiting.  He had midsternal chest pain felt as a dull aching pain and graded 7/10 in severity with associated dyspnea, diaphoresis and palpitations.  He denies any associated nausea or vomiting or any radiation of his pain.  He admits to urinary hesitancy with low stream.  No cough or wheezing or hemoptysis.  Upon presentation to the emergency room, heart rate was 124 with otherwise normal vital signs.  Labs revealed mild hyponatremia and a potassium of 3.5 with hyperglycemia of 392, creatinine 1.24 with BUN of 14.  High-sensitivity troponin I was 3 and lactic acid was 1.8 CBC showed leukocytosis of 12.9.  UA showed more than 500 glucose, 100 protein, 20 ketones and 6-10 WBCs with no bacteria and negative nitrite.  Urine culture was sent as well as 2 blood cultures.  COVID-19 PCR came back negative chest x-ray showed no acute intrathoracic abnormalities.  Chest and abdomen CTA revealed no PE or acute thoracic abnormality.  CT of the abdomen and pelvis revealed a right peroneal abscess measuring up to 7.2 X5.1X 2 cm.  The patient was given IV vancomycin and Zosyn, 5 units subcu NovoLog, hydration with IV normal saline and 4 mg of IV morphine sulfate.  Dr. Carolynne Edouard was notified but the patient and he is aware.  The patient will be admitted to a medical bed for further evaluation and  management  Assessment & Plan:   Active Problems:   Groin abscess   Perineal abscess   Precordial pain   Type 2 diabetes mellitus with hyperlipidemia (HCC)   Hypothyroidism   Chest pain   Tobacco abuse   Hypokalemia  1 right groin abscess/right perineal abscess with surrounding severe nonpurulent cellulitis. Patient presented with right groin pain, CT done concerning for right perineal abscess.  Patient seen by general surgery and patient status post irrigation and debridement right groin abscess.  Cultures pending.  Continue empiric IV vancomycin IV Zosyn.  Once cultures have resulted may need to discuss with ID for antibiotic recommendations and duration.  General surgery following.   2.  Chest pain Patient presented with chest pain with atypical features however patient with multiple risk factors of poorly controlled diabetes, hyperlipidemia, hypothyroidism, ongoing tobacco use.  Patient currently chest pain-free.  EKG with no ischemic changes noted.  Cardiac enzymes that were cycled negative.  2D echo with EF of 60 to 65%, no wall motion abnormalities.  Due to multiple cardiac risk factors cardiology was consulted and patient was seen in consultation.  Cardiology recommending outpatient CTA to exclude coronary disease once patient has been discharged from his perineal abscess with outpatient follow-up with APP in 2 to 4 weeks.  Continue aspirin, Lipitor.  Follow.  3.  Hypothyroidism TSH noted to be elevated 33.277.  Patient noted to have not taken any medications in 2 years.  Patient's home regimen of Synthroid has been  resumed.  Will need repeat thyroid function studies done in about 4 to 6 weeks.  Outpatient follow-up with PCP.  4.  Tobacco abuse Tobacco cessation.  5.  Hyperlipidemia Fasting lipid panel with LDL of 158.  Continue Lipitor.  Follow.  6.  Uncontrolled diabetes mellitus type 2 Patient noted to be hyperglycemic on admission.  Patient hydrated with IV fluids.  Patient  has not been on his oral hypoglycemic agents in over 2 years as he states due to financial issues, lost his insurance and as such states PCP would not see him anymore.  Hemoglobin A1c 10.0.  CBG of 225 this morning.  Increase Lantus to 34 units daily.  Place on meal coverage NovoLog 4 units 3 times daily with meals.  Given additional dose of Lantus 4 units this morning.  Diabetic coordinator has been consulted and are following.  Saline lock IV fluids.  7.  Hypokalemia Potassium at 3.4.  Replete.  Magnesium at 2.0.  Follow.   DVT prophylaxis: SCDs Code Status: Full Family Communication: Updated patient.  No family at bedside. Disposition:   Status is: Inpatient    Dispo: The patient is from: Home              Anticipated d/c is to: Home              Anticipated d/c date is: To be determined              Patient currently on IV antibiotics, status post incision and debridement with cultures pending.  Elevated blood glucose levels with insulin being adjusted.  Not stable for discharge.        Consultants:   General surgery: Dr. Ezzard Standing 03/21/2020  Cardiology: Dr. Jens Som 03/21/2020  Procedures:   2D echo pending 03/21/2020  CT abdomen and pelvis 03/20/2020  CT angiogram chest 03/20/2020  Chest x-ray 03/20/2020  Antimicrobials:   IV Zosyn 03/20/2020>>>>  IV Vancomycin 03/20/2020 >>>>>>   Subjective: Patient in bed.  Feels a little bit better than he did yesterday.  Denies any chest pain no shortness of breath.  Still with some pain in the right groin area.   Objective: Vitals:   03/21/20 2243 03/22/20 0229 03/22/20 0704 03/22/20 0953  BP: (!) 102/59 105/60 123/72 121/64  Pulse: 92 83 97 90  Resp: 16 16 16 16   Temp: 98.7 F (37.1 C) 97.9 F (36.6 C) 97.7 F (36.5 C) 97.8 F (36.6 C)  TempSrc: Oral Oral Oral Oral  SpO2: 98% 99% 100% 100%  Weight:      Height:        Intake/Output Summary (Last 24 hours) at 03/22/2020 1111 Last data filed at 03/22/2020 1000 Gross  per 24 hour  Intake 3123.37 ml  Output 1805 ml  Net 1318.37 ml   Filed Weights   03/20/20 1950 03/21/20 1323  Weight: 88 kg 88 kg    Examination:  General exam: NAD Respiratory system: CTAB.  No wheezes, no crackles, no rhonchi.  Normal respiratory effort.   Cardiovascular system: Regular rate rhythm no murmurs rubs or gallops.  No JVD.  No lower extremity edema.  Gastrointestinal system: Abdomen is soft, nontender, nondistended, positive bowel sounds.  No rebound.  No guarding. Central nervous system: Alert and oriented. No focal neurological deficits. Extremities: Symmetric 5 x 5 power. GU: Right groin/scrotal region with dressing in place.  Some tenderness to palpation. Skin: No rashes, lesions or ulcers Psychiatry: Judgement and insight appear normal. Mood & affect appropriate.  Data Reviewed: I have personally reviewed following labs and imaging studies  CBC: Recent Labs  Lab 03/20/20 2034 03/21/20 0338 03/22/20 0322  WBC 12.9* 14.1* 16.6*  NEUTROABS  --   --  15.1*  HGB 13.9 12.8* 11.5*  HCT 41.4 37.1* 34.6*  MCV 86.1 84.9 85.9  PLT 257 262 264    Basic Metabolic Panel: Recent Labs  Lab 03/20/20 2034 03/21/20 0616 03/21/20 0622 03/22/20 0322  NA 134* 138  --  139  K 3.5 3.2*  --  3.4*  CL 97* 100  --  102  CO2 25 23  --  26  GLUCOSE 392* 213*  --  267*  BUN 14 14  --  19  CREATININE 1.24 1.02  --  1.40*  CALCIUM 8.7* 8.8*  --  8.6*  MG  --   --  1.9 2.0    GFR: Estimated Creatinine Clearance: 66.2 mL/min (A) (by C-G formula based on SCr of 1.4 mg/dL (H)).  Liver Function Tests: Recent Labs  Lab 03/20/20 2034  AST 23  ALT 34  ALKPHOS 96  BILITOT 1.2  PROT 8.0  ALBUMIN 3.6    CBG: Recent Labs  Lab 03/21/20 1522 03/21/20 1716 03/21/20 2017 03/22/20 0219 03/22/20 0815  GLUCAP 151* 146* 325* 274* 225*     Recent Results (from the past 240 hour(s))  Urine Culture     Status: Abnormal   Collection Time: 03/20/20  8:43 PM    Specimen: Urine, Clean Catch  Result Value Ref Range Status   Specimen Description   Final    URINE, CLEAN CATCH Performed at [facility], [address], [place], [place] [address]    Special Requests   Final    NONE Performed at [facility], [address], [place], [place] [address]    Culture (A)  Final    <10,000 COLONIES/mL INSIGNIFICANT GROWTH Performed at [facility], [address] [place], [place] [address]    Report Status 03/22/2020 FINAL  Final  SARS Coronavirus 2 by RT PCR (hospital order, performed in [facility] hospital lab) Nasopharyngeal Nasopharyngeal Swab     Status: None   Collection Time: 03/20/20  8:54 PM   Specimen: Nasopharyngeal Swab  Result Value Ref Range Status   SARS Coronavirus 2 NEGATIVE NEGATIVE Final    Comment: (NOTE) SARS-CoV-2 target nucleic acids are NOT DETECTED.  The SARS-CoV-2 RNA is generally detectable in upper and lower respiratory specimens during the acute phase of infection. The lowest concentration of SARS-CoV-2 viral copies this assay can detect is 250 copies / mL. A negative result does not preclude SARS-CoV-2 infection and should not be used as the sole basis for treatment or other patient management decisions.  A negative result may occur with improper specimen collection / handling, submission of specimen other than nasopharyngeal swab, presence of viral mutation(s) within the areas targeted by this assay, and inadequate number of viral copies (<250 copies / mL). A negative result must be combined with clinical observations, patient history, and epidemiological information.  Fact Sheet for Patients:   BoilerBrush.com.cy  Fact Sheet for Healthcare Providers: https://pope.com/  This test is not yet approved or  cleared by the [place] FDA and has been authorized for detection and/or diagnosis of SARS-CoV-2 by FDA under an  Emergency Use Authorization (EUA).  This EUA will remain in effect (meaning this test can be used) for the duration of the COVID-19 declaration under Section 564(b)(1) of the  Act, 21 U.S.C. section 360bbb-3(b)(1), unless the authorization is terminated or revoked sooner.  Performed at [facility], [address] [place], [place] [address]   Aerobic/Anaerobic Culture (surgical/deep wound)     Status: None (Preliminary result)   Collection Time: 03/21/20  3:11 PM   Specimen: PATH Other; Tissue  Result Value Ref Range Status   Specimen Description   Final    ABSCESS RIGHT GROIN PERINEAL Performed at [facility], [address] [place], [place] [address]    Special Requests   Final    NONE Performed at [facility], [address] [place], [place] [address]    Gram Stain   Final    RARE WBC PRESENT, PREDOMINANTLY PMN ABUNDANT GRAM POSITIVE COCCI ABUNDANT GRAM NEGATIVE RODS FEW GRAM POSITIVE RODS Performed at [facility], [address] [place], [place] [address]    Culture PENDING  Incomplete   Report Status PENDING  Incomplete         Radiology Studies: CT Angio Chest PE W and/or Wo Contrast  Result Date: 03/20/2020 CLINICAL DATA:  Chest pain and shortness of breath EXAM: CT ANGIOGRAPHY CHEST WITH CONTRAST TECHNIQUE: Multidetector CT imaging of the chest was performed using the standard protocol during bolus administration of intravenous contrast. Multiplanar CT image reconstructions and MIPs were obtained to evaluate the vascular anatomy. CONTRAST:  100mL OMNIPAQUE IOHEXOL 350 MG/ML SOLN COMPARISON:  None. FINDINGS: Cardiovascular: Contrast injection is sufficient to demonstrate satisfactory opacification of the pulmonary arteries to the segmental level. There is no pulmonary embolus or evidence of right heart strain. The size of the main pulmonary artery is normal. Heart size is normal, with no pericardial  effusion. The course and caliber of the aorta are normal. There is no atherosclerotic calcification. Opacification decreased due to pulmonary arterial phase contrast bolus timing. Mediastinum/Nodes: No mediastinal, hilar or axillary lymphadenopathy. Normal visualized thyroid. Thoracic esophageal course is normal. Lungs/Pleura: Airways are patent. Bibasilar atelectasis. No pleural effusion, lobar consolidation, pneumothorax or pulmonary infarction. Upper Abdomen: Contrast bolus timing is not optimized for evaluation of the abdominal organs. The visualized portions of the organs of the upper abdomen are normal. Musculoskeletal: No chest wall abnormality. No bony spinal canal stenosis. Review of the MIP images confirms the above findings. findings IMPRESSION: No pulmonary embolus or other acute thoracic abnormality. Electronically Signed   By: Deatra RobinsonKevin  Herman M.D.   On: 03/20/2020 23:04   CT ABDOMEN PELVIS W CONTRAST  Result Date: 03/20/2020 CLINICAL DATA:  Perineal swelling EXAM: CT ABDOMEN AND PELVIS WITH CONTRAST TECHNIQUE: Multidetector CT imaging of the abdomen and pelvis was performed using the standard protocol following bolus administration of intravenous contrast. CONTRAST:  100mL OMNIPAQUE IOHEXOL 350 MG/ML SOLN COMPARISON:  01/28/2017 FINDINGS: Hepatobiliary: Normal hepatic contours and density. No visible biliary dilatation. Normal gallbladder. Pancreas: Normal contours without ductal dilatation. No peripancreatic fluid collection. Spleen: Normal. Adrenals/Urinary Tract: --Adrenal glands: Normal. --Right kidney/ureter: No hydronephrosis or perinephric stranding. No nephrolithiasis. No obstructing ureteral stones. --Left kidney/ureter: No hydronephrosis or perinephric stranding. No nephrolithiasis. No obstructing ureteral stones. --Urinary bladder: Unremarkable. Stomach/Bowel: --Stomach/Duodenum: No hiatal hernia or other gastric abnormality. Normal duodenal course and caliber. --Small bowel: No dilatation  or inflammation. --Colon: No focal abnormality. --Appendix: Normal. Vascular/Lymphatic: Normal course and caliber of the major abdominal vessels. No abdominal or pelvic lymphadenopathy. Reproductive: Headache there is a gas and fluid containing right perineal collection that measures 7.2 x 5.1 x 2.0 cm. Musculoskeletal. No bony spinal canal stenosis  or focal osseous abnormality. Other: None. IMPRESSION: Right perineal abscess measuring up to 7.2 x 5.1 x 2.0 cm. Electronically Signed   By: Deatra Robinson M.D.   On: 03/20/2020 23:51   DG Chest Port 1 View  Result Date: 03/20/2020 CLINICAL DATA:  Shortness of breath EXAM: PORTABLE CHEST 1 VIEW COMPARISON:  07/03/2012 FINDINGS: The heart size and mediastinal contours are within normal limits. Both lungs are clear. The visualized skeletal structures are unremarkable. IMPRESSION: No active disease. Electronically Signed   By: Alcide Clever M.D.   On: 03/20/2020 20:26   ECHOCARDIOGRAM COMPLETE  Result Date: 03/21/2020    ECHOCARDIOGRAM REPORT   Patient Name:   TRINTON PREWITT Date of Exam: 03/21/2020 Medical Rec #:  [id number]       Height:       69.0 in Accession #:    [id number]      Weight:       194.0 lb Date of Birth:  09/29/1965       BSA:          2.039 m Patient Age:    54 years        BP:           112/67 mmHg Patient Gender: M               HR:           94 bpm. Exam Location:  Inpatient Procedure: 2D Echo Indications:    Chest Pain  History:        Patient has no prior history of Echocardiogram examinations.                 Risk Factors:Diabetes and Current Smoker.  Sonographer:    Thurman Coyer RDCS (AE) Referring Phys: [id number] Lus Kriegel V Lauralye Kinn IMPRESSIONS  1. Left ventricular ejection fraction, by estimation, is 60 to 65%. The left ventricle has normal function. The left ventricle has no regional wall motion abnormalities. Left ventricular diastolic function could not be evaluated.  2. Right ventricular systolic function is normal. The right ventricular  size is normal. Tricuspid regurgitation signal is inadequate for assessing PA pressure.  3. The mitral valve is normal in structure. Trivial mitral valve regurgitation. No evidence of mitral stenosis.  4. The aortic valve is normal in structure. Aortic valve regurgitation is not visualized. No aortic stenosis is present.  5. The inferior vena cava is normal in size with <50% respiratory variability, suggesting right atrial pressure of 8 mmHg. FINDINGS  Left Ventricle: Left ventricular ejection fraction, by estimation, is 60 to 65%. The left ventricle has normal function. The left ventricle has no regional wall motion abnormalities. The left ventricular internal cavity size was normal in size. There is  no left ventricular hypertrophy. Left ventricular diastolic function could not be evaluated. Right Ventricle: The right ventricular size is normal. No increase in right ventricular wall thickness. Right ventricular systolic function is normal. Tricuspid regurgitation signal is inadequate for assessing PA pressure. Left Atrium: Left atrial size was normal in size. Right Atrium: Right atrial size was normal in size. Pericardium: There is no evidence of pericardial effusion. Mitral Valve: The mitral valve is normal in structure. Trivial mitral valve regurgitation. No evidence of mitral valve stenosis. Tricuspid Valve: The tricuspid valve is normal in structure. Tricuspid valve regurgitation is trivial. No evidence of tricuspid stenosis. Aortic Valve: The aortic valve is normal in structure. Aortic valve regurgitation is not visualized. No aortic stenosis is present. Pulmonic Valve: The pulmonic  valve was normal in structure. Pulmonic valve regurgitation is not visualized. No evidence of pulmonic stenosis. Aorta: The aortic root is normal in size and structure. Venous: The inferior vena cava is normal in size with less than 50% respiratory variability, suggesting right atrial pressure of 8 mmHg. IAS/Shunts: No atrial  level shunt detected by color flow Doppler.  LEFT VENTRICLE PLAX 2D LVIDd:         4.30 cm  Diastology LVIDs:         2.95 cm  LV e' lateral: 9.39 cm/s LV PW:         0.90 cm LV IVS:        0.80 cm LVOT diam:     2.10 cm LV SV:         70 LV SV Index:   34 LVOT Area:     3.46 cm  RIGHT VENTRICLE RV S prime:     10.60 cm/s TAPSE (M-mode): 2.4 cm LEFT ATRIUM             Index       RIGHT ATRIUM           Index LA diam:        3.40 cm 1.67 cm/m  RA Area:     14.70 cm LA Vol (A2C):   38.2 ml 18.73 ml/m RA Volume:   39.00 ml  19.12 ml/m LA Vol (A4C):   34.5 ml 16.92 ml/m LA Biplane Vol: 36.3 ml 17.80 ml/m  AORTIC VALVE LVOT Vmax:   116.00 cm/s LVOT Vmean:  75.200 cm/s LVOT VTI:    0.202 m  AORTA Ao Root diam: 3.00 cm Ao Asc diam:  3.00 cm  SHUNTS Systemic VTI:  0.20 m Systemic Diam: 2.10 cm Armanda Magic MD Electronically signed by Armanda Magic MD Signature Date/Time: 03/21/2020/1:13:09 PM    Final         Scheduled Meds: . acetaminophen  1,000 mg Oral Q6H  . aspirin EC  81 mg Oral Daily  . atorvastatin  40 mg Oral Daily  . enoxaparin (LOVENOX) injection  40 mg Subcutaneous Q24H  . insulin aspart  0-20 Units Subcutaneous Q6H  . insulin aspart  4 Units Subcutaneous TID WC  . insulin glargine  34 Units Subcutaneous Daily  . levothyroxine  112 mcg Oral Q0600  . pantoprazole  40 mg Oral Q0600  . tamsulosin  0.4 mg Oral Daily   Continuous Infusions: . sodium chloride 100 mL/hr at 03/22/20 0611  . cefTRIAXone (ROCEPHIN)  IV    . metronidazole    . vancomycin 1,000 mg (03/22/20 0350)     LOS: 1 day    Time spent: 35 minutes     Ramiro Harvest, MD [facility]   To contact the attending provider between 7A-7P or the covering provider during after hours 7P-7A, please log into the web site [email or web address] and access using universal [place] password for that web site. If you do not have the password, please call the hospital operator.  03/22/2020, 11:11 AM

## 2020-03-23 ENCOUNTER — Other Ambulatory Visit (HOSPITAL_COMMUNITY): Payer: Self-pay | Admitting: Internal Medicine

## 2020-03-23 LAB — BASIC METABOLIC PANEL
Anion gap: 10 (ref 5–15)
BUN: 16 mg/dL (ref 6–20)
CO2: 26 mmol/L (ref 22–32)
Calcium: 8.6 mg/dL — ABNORMAL LOW (ref 8.9–10.3)
Chloride: 106 mmol/L (ref 98–111)
Creatinine, Ser: 1.07 mg/dL (ref 0.61–1.24)
GFR calc Af Amer: >60 mL/min (ref >60)
GFR calc non Af Amer: >60 mL/min (ref >60)
Glucose, Bld: 110 mg/dL — ABNORMAL HIGH (ref 70–99)
Potassium: 3.3 mmol/L — ABNORMAL LOW (ref 3.5–5.1)
Sodium: 142 mmol/L (ref 135–145)

## 2020-03-23 LAB — CBC WITH DIFFERENTIAL/PLATELET
Abs Immature Granulocytes: 0.34 10*3/uL — ABNORMAL HIGH (ref 0.00–0.07)
Basophils Absolute: 0.1 10*3/uL (ref 0.0–0.1)
Basophils Relative: 0 %
Eosinophils Absolute: 0 10*3/uL (ref 0.0–0.5)
Eosinophils Relative: 0 %
HCT: 35.3 % — ABNORMAL LOW (ref 39.0–52.0)
Hemoglobin: 11.7 g/dL — ABNORMAL LOW (ref 13.0–17.0)
Immature Granulocytes: 2 %
Lymphocytes Relative: 13 %
Lymphs Abs: 2 10*3/uL (ref 0.7–4.0)
MCH: 28.5 pg (ref 26.0–34.0)
MCHC: 33.1 g/dL (ref 30.0–36.0)
MCV: 86.1 fL (ref 80.0–100.0)
Monocytes Absolute: 1 10*3/uL (ref 0.1–1.0)
Monocytes Relative: 6 %
Neutro Abs: 12.3 10*3/uL — ABNORMAL HIGH (ref 1.7–7.7)
Neutrophils Relative %: 79 %
Platelets: 306 10*3/uL (ref 150–400)
RBC: 4.1 MIL/uL — ABNORMAL LOW (ref 4.22–5.81)
RDW: 11.5 % (ref 11.5–15.5)
WBC: 15.7 10*3/uL — ABNORMAL HIGH (ref 4.0–10.5)
nRBC: 0 % (ref 0.0–0.2)

## 2020-03-23 LAB — T4, FREE: Free T4: 1.2 ng/dL — ABNORMAL HIGH (ref 0.61–1.12)

## 2020-03-23 LAB — TROPONIN I (HIGH SENSITIVITY): Troponin I (High Sensitivity): 3 ng/L (ref <18)

## 2020-03-23 LAB — GLUCOSE, CAPILLARY
Glucose-Capillary: 102 mg/dL — ABNORMAL HIGH (ref 70–99)
Glucose-Capillary: 120 mg/dL — ABNORMAL HIGH (ref 70–99)
Glucose-Capillary: 80 mg/dL (ref 70–99)

## 2020-03-23 MED ORDER — INSULIN SYRINGES (DISPOSABLE) U-100 0.5 ML MISC: 3 refills | Status: DC

## 2020-03-23 MED ORDER — ASPIRIN 81 MG PO TBEC
81.0000 mg | DELAYED_RELEASE_TABLET | Freq: Every day | ORAL | 11 refills | Status: DC
Start: 2020-03-24 — End: 2023-05-07

## 2020-03-23 MED ORDER — NITROGLYCERIN 0.4 MG SL SUBL: 0.4000 mg | SUBLINGUAL_TABLET | SUBLINGUAL | 12 refills | Status: DC | PRN

## 2020-03-23 MED ORDER — TAMSULOSIN HCL 0.4 MG PO CAPS: 0.4000 mg | ORAL_CAPSULE | Freq: Every day | ORAL | 1 refills | Status: DC

## 2020-03-23 MED ORDER — AMOXICILLIN-POT CLAVULANATE 875-125 MG PO TABS: 1.0000 | ORAL_TABLET | Freq: Two times a day (BID) | ORAL | 0 refills | Status: DC

## 2020-03-23 MED ORDER — ATORVASTATIN CALCIUM 40 MG PO TABS
40.0000 mg | ORAL_TABLET | Freq: Every day | ORAL | 1 refills | Status: DC
Start: 2020-03-24 — End: 2020-03-23

## 2020-03-23 MED ORDER — METOPROLOL TARTRATE 100 MG PO TABS: 100.0000 mg | ORAL_TABLET | Freq: Once | ORAL | 0 refills | Status: DC

## 2020-03-23 MED ORDER — INSULIN GLARGINE 100 UNIT/ML SOLOSTAR PEN: 35.0000 [IU] | PEN_INJECTOR | Freq: Every day | SUBCUTANEOUS | 11 refills | Status: DC

## 2020-03-23 MED ORDER — METFORMIN HCL 500 MG PO TABS: 500.0000 mg | ORAL_TABLET | Freq: Two times a day (BID) | ORAL | 0 refills | Status: DC

## 2020-03-23 MED ORDER — ASPIRIN 81 MG PO TBEC
81.0000 mg | DELAYED_RELEASE_TABLET | Freq: Every day | ORAL | 11 refills | Status: DC
Start: 2020-03-24 — End: 2020-03-23

## 2020-03-23 MED ORDER — POTASSIUM CHLORIDE CRYS ER 20 MEQ PO TBCR
40.0000 meq | EXTENDED_RELEASE_TABLET | Freq: Once | ORAL | Status: AC
  Administered 2020-03-23: 40 meq via ORAL
  Filled 2020-03-23: qty 2

## 2020-03-23 MED ORDER — AMOXICILLIN-POT CLAVULANATE 875-125 MG PO TABS: 1.0000 | ORAL_TABLET | Freq: Two times a day (BID) | ORAL | 0 refills | Status: AC

## 2020-03-23 MED ORDER — OXYCODONE HCL 5 MG PO TABS
5.0000 mg | ORAL_TABLET | ORAL | 0 refills | Status: DC | PRN
Start: 2020-03-23 — End: 2020-07-06

## 2020-03-23 MED ORDER — PANTOPRAZOLE SODIUM 40 MG PO TBEC
40.0000 mg | DELAYED_RELEASE_TABLET | Freq: Every day | ORAL | 0 refills | Status: DC
Start: 2020-03-24 — End: 2020-03-23

## 2020-03-23 MED ORDER — ATORVASTATIN CALCIUM 40 MG PO TABS
40.0000 mg | ORAL_TABLET | Freq: Every day | ORAL | 1 refills | Status: DC
Start: 2020-03-24 — End: 2020-04-25

## 2020-03-23 MED ORDER — LEVOTHYROXINE SODIUM 112 MCG PO TABS: 112.0000 ug | ORAL_TABLET | Freq: Every day | ORAL | 1 refills | Status: DC

## 2020-03-23 MED ORDER — INSULIN SYRINGES (DISPOSABLE) U-100 0.5 ML MISC: 0 refills | Status: DC

## 2020-03-23 MED ORDER — INSULIN ASPART 100 UNIT/ML FLEXPEN: PEN_INJECTOR | SUBCUTANEOUS | 11 refills | Status: DC

## 2020-03-23 MED ORDER — PANTOPRAZOLE SODIUM 40 MG PO TBEC
40.0000 mg | DELAYED_RELEASE_TABLET | Freq: Every day | ORAL | 0 refills | Status: DC
Start: 2020-03-24 — End: 2020-07-06

## 2020-03-23 MED FILL — AMOX-CLAV 875-125 MG TABLET: 875-125 | 10 days supply | Qty: 20 | Fill #0

## 2020-03-23 MED FILL — METOPROLOL TARTRATE 100 MG: 100 | 1 days supply | Qty: 1 | Fill #0

## 2020-03-23 MED FILL — NITROGLYCERIN 0.4 MG TAB SL: 0.4 | 25 days supply | Qty: 25 | Fill #0

## 2020-03-23 MED FILL — TAMSULOSIN HCL 0.4 MG CAP: 0.4 | 30 days supply | Qty: 30 | Fill #0

## 2020-03-23 MED FILL — !LANTUS SOLOSTAR 100UNITS/M: 100 | 25 days supply | Qty: 9 | Fill #0

## 2020-03-23 MED FILL — METFORMIN HCL 500 MG TABS: 500 | 30 days supply | Qty: 60 | Fill #0

## 2020-03-23 MED FILL — PANTOPRAZOLE SOD DR 40 MG T: 40 | 30 days supply | Qty: 30 | Fill #0

## 2020-03-23 MED FILL — ATORVASTATIN CALCIUM 40 MG: 40 | 30 days supply | Qty: 30 | Fill #0

## 2020-03-23 MED FILL — LEVOTHYROXINE SODIUM 112 MC: 112 | 30 days supply | Qty: 30 | Fill #0

## 2020-03-23 NOTE — Care Management Important Message (Signed)
Important Message  Patient Details IM Letter given to the Patient Name: JALEIL RENWICK MRN: [id number] Date of Birth: 01/08/1966   Medicare Important Message Given:  Yes     Caren Macadam 03/23/2020, 2:25 PM

## 2020-03-23 NOTE — Progress Notes (Addendum)
2 Days Post-Op  Subjective: Patient states dressing change last night went well.  No other comlpaints.  ROS: See above, otherwise other systems negative  Objective: Vital signs in last 24 hours: Temp:  [97.5 F (36.4 C)-98 F (36.7 C)] 97.5 F (36.4 C) (09/22 0529) Pulse Rate:  [87-98] 87 (09/22 0529) Resp:  [14-22] 17 (09/22 0529) BP: (107-125)/(63-83) 107/63 (09/22 0529) SpO2:  [97 %-100 %] 98 % (09/22 0529) Last BM Date: 03/22/20  Intake/Output from previous day: 09/21 0701 - 09/22 0700 In: 2908.1 [P.O.:840; I.V.:1244; IV Piggyback:824.2] Out: 2775 [Urine:2775] Intake/Output this shift: Total I/O In: 120 [P.O.:120] Out: -   PE: Skin: wound is clean with no purulent drainage.  No erythema or induration noted.    Lab Results:  Recent Labs    03/22/20 0322 03/23/20 0318  WBC 16.6* 15.7*  HGB 11.5* 11.7*  HCT 34.6* 35.3*  PLT 264 306   BMET Recent Labs    03/22/20 0322 03/23/20 0318  NA 139 142  K 3.4* 3.3*  CL 102 106  CO2 26 26  GLUCOSE 267* 110*  BUN 19 16  CREATININE 1.40* 1.07  CALCIUM 8.6* 8.6*   PT/INR No results for input(s): LABPROT, INR in the last 72 hours. CMP     Component Value Date/Time   NA 142 03/23/2020 0318   K 3.3 (L) 03/23/2020 0318   CL 106 03/23/2020 0318   CO2 26 03/23/2020 0318   GLUCOSE 110 (H) 03/23/2020 0318   BUN 16 03/23/2020 0318   CREATININE 1.07 03/23/2020 0318   CALCIUM 8.6 (L) 03/23/2020 0318   PROT 8.0 03/20/2020 2034   ALBUMIN 3.6 03/20/2020 2034   AST 23 03/20/2020 2034   ALT 34 03/20/2020 2034   ALKPHOS 96 03/20/2020 2034   BILITOT 1.2 03/20/2020 2034   GFRNONAA >60 03/23/2020 0318   GFRAA >60 03/23/2020 0318   Lipase     Component Value Date/Time   LIPASE 37 01/28/2017 0948       Studies/Results: ECHOCARDIOGRAM COMPLETE  Result Date: 03/21/2020    ECHOCARDIOGRAM REPORT   Patient Name:   Kevin Oneill Date of Exam: 03/21/2020 Medical Rec #:  [id number]       Height:       69.0 in  Accession #:    [id number]      Weight:       194.0 lb Date of Birth:  [birth year]       BSA:          2.039 m Patient Age:    54 years        BP:           112/67 mmHg Patient Gender: M               HR:           94 bpm. Exam Location:  Inpatient Procedure: 2D Echo Indications:    Chest Pain  History:        Patient has no prior history of Echocardiogram examinations.                 Risk Factors:Diabetes and Current Smoker.  Sonographer:    Thurman Coyer RDCS (AE) Referring Phys: [id number] DANIEL V THOMPSON IMPRESSIONS  1. Left ventricular ejection fraction, by estimation, is 60 to 65%. The left ventricle has normal function. The left ventricle has no regional wall motion abnormalities. Left ventricular diastolic function could not be evaluated.  2. Right ventricular systolic function is  normal. The right ventricular size is normal. Tricuspid regurgitation signal is inadequate for assessing PA pressure.  3. The mitral valve is normal in structure. Trivial mitral valve regurgitation. No evidence of mitral stenosis.  4. The aortic valve is normal in structure. Aortic valve regurgitation is not visualized. No aortic stenosis is present.  5. The inferior vena cava is normal in size with <50% respiratory variability, suggesting right atrial pressure of 8 mmHg. FINDINGS  Left Ventricle: Left ventricular ejection fraction, by estimation, is 60 to 65%. The left ventricle has normal function. The left ventricle has no regional wall motion abnormalities. The left ventricular internal cavity size was normal in size. There is  no left ventricular hypertrophy. Left ventricular diastolic function could not be evaluated. Right Ventricle: The right ventricular size is normal. No increase in right ventricular wall thickness. Right ventricular systolic function is normal. Tricuspid regurgitation signal is inadequate for assessing PA pressure. Left Atrium: Left atrial size was normal in size. Right Atrium: Right atrial size was  normal in size. Pericardium: There is no evidence of pericardial effusion. Mitral Valve: The mitral valve is normal in structure. Trivial mitral valve regurgitation. No evidence of mitral valve stenosis. Tricuspid Valve: The tricuspid valve is normal in structure. Tricuspid valve regurgitation is trivial. No evidence of tricuspid stenosis. Aortic Valve: The aortic valve is normal in structure. Aortic valve regurgitation is not visualized. No aortic stenosis is present. Pulmonic Valve: The pulmonic valve was normal in structure. Pulmonic valve regurgitation is not visualized. No evidence of pulmonic stenosis. Aorta: The aortic root is normal in size and structure. Venous: The inferior vena cava is normal in size with less than 50% respiratory variability, suggesting right atrial pressure of 8 mmHg. IAS/Shunts: No atrial level shunt detected by color flow Doppler.  LEFT VENTRICLE PLAX 2D LVIDd:         4.30 cm  Diastology LVIDs:         2.95 cm  LV e' lateral: 9.39 cm/s LV PW:         0.90 cm LV IVS:        0.80 cm LVOT diam:     2.10 cm LV SV:         70 LV SV Index:   34 LVOT Area:     3.46 cm  RIGHT VENTRICLE RV S prime:     10.60 cm/s TAPSE (M-mode): 2.4 cm LEFT ATRIUM             Index       RIGHT ATRIUM           Index LA diam:        3.40 cm 1.67 cm/m  RA Area:     14.70 cm LA Vol (A2C):   38.2 ml 18.73 ml/m RA Volume:   39.00 ml  19.12 ml/m LA Vol (A4C):   34.5 ml 16.92 ml/m LA Biplane Vol: 36.3 ml 17.80 ml/m  AORTIC VALVE LVOT Vmax:   116.00 cm/s LVOT Vmean:  75.200 cm/s LVOT VTI:    0.202 m  AORTA Ao Root diam: 3.00 cm Ao Asc diam:  3.00 cm  SHUNTS Systemic VTI:  0.20 m Systemic Diam: 2.10 cm Armanda Magic MD Electronically signed by Armanda Magic MD Signature Date/Time: 03/21/2020/1:13:09 PM    Final     Anti-infectives: Anti-infectives (From admission, onward)    Start     Dose/Rate Route Frequency Ordered Stop   03/22/20 1200  cefTRIAXone (ROCEPHIN) 2 g in sodium chloride 0.9 %  100 mL IVPB         2 g 200 mL/hr over 30 Minutes Intravenous Every 24 hours 03/22/20 0830     03/22/20 1200  metroNIDAZOLE (FLAGYL) IVPB 500 mg        500 mg 100 mL/hr over 60 Minutes Intravenous Every 8 hours 03/22/20 0830     03/21/20 0400  piperacillin-tazobactam (ZOSYN) IVPB 3.375 g  Status:  Discontinued        3.375 g 12.5 mL/hr over 240 Minutes Intravenous Every 8 hours 03/21/20 0055 03/22/20 0830   03/21/20 0400  vancomycin (VANCOCIN) IVPB 1000 mg/200 mL premix        1,000 mg 200 mL/hr over 60 Minutes Intravenous Every 12 hours 03/21/20 0055     03/20/20 2100  piperacillin-tazobactam (ZOSYN) IVPB 3.375 g        3.375 g 100 mL/hr over 30 Minutes Intravenous  Once 03/20/20 2057 03/20/20 2229   03/20/20 2100  vancomycin (VANCOCIN) IVPB 1000 mg/200 mL premix        1,000 mg 200 mL/hr over 60 Minutes Intravenous  Once 03/20/20 2057 03/21/20 0023   03/20/20 0000  clindamycin (CLEOCIN) 300 MG capsule        300 mg Oral 3 times daily 03/20/20 2324          Assessment/Plan Uncontrolled DM - per medicine Hypothyroidism  HLD   POD 2, s/p I&D of right groin/perineal abscess, Dr. Ezzard Standing 9/20 -NS WD dressing changes BID -will try to arrange for [facility] -wound is overall very clean -WBC down to 15K today. -can likely begin to start to narrow and convert to oral abx therapy as wound is clean.  cx shows multiple organisms, none predominant, but gram + and gram -.  Can likely switch to augmentin for a total of 10 days given his uncontrolled DM. -will send referral to Dr. Mikey Bussing with wound care for follow up. -patient is surgically stable for DC when felt to be medically stable.    FEN - carb mod diet VTE - SCDs, lovenox ID - Rocephin/Flagyl/Vanc   LOS: 2 days    Letha Cape , [facility] Surgery 03/23/2020, 10:48 AM Please see Amion for pager number during day hours 7:00am-4:30pm or 7:00am -11:30am on weekends  Agree with above.  Ovidio Kin, MD, [facility]  Surgery Office phone:  [phone]

## 2020-03-23 NOTE — TOC Transition Note (Signed)
Transition of Care [facility]) - CM/SW Discharge Note   Patient Details  Name: Kevin Oneill MRN: [id number] Date of Birth: 06/26/1966  Transition of Care [facility] CM/SW Contact:  Clearance Coots, LCSW Phone Number: 03/23/2020, 2:55 PM   Clinical Narrative:    Medications sent to [facility] for one time free fill. Patient made aware. [facility] will provide RN for wound care. Patient notified start of care will begin this weekend. Patient made aware. Nurse will provide wound care dressing education to the patient fiance today and send additional wound care supplies home.  On call Hospitalist-Dr. Danford notified patient will need physician to complete home health orders. CSW provided agency staff Cindie with contact information.  No other need identified.   Final next level of care: Home w Home Health Services Barriers to Discharge: Barriers Resolved   Patient Goals and CMS Choice Patient states their goals for this hospitalization and ongoing recovery are:: return home   Choice offered to / list presented to : NA  Discharge Placement                       Discharge Plan and Services In-house Referral: Clinical Social Work Discharge Planning Services: MATCH Program, CM Consult, Medication Assistance, Follow-up appt scheduled                      HH Arranged: RN HH Agency: [facility] Date [facility] Agency Contacted: 03/23/20 Time HH Agency Contacted: [phone] Representative spoke with at [facility] Agency: Cindie  Social Determinants of Health (SDOH) Interventions     Readmission Risk Interventions No flowsheet data found.

## 2020-03-24 LAB — T3, FREE: T3, Free: 1.9 pg/mL — ABNORMAL LOW (ref 2.0–4.4)

## 2020-03-24 NOTE — Discharge Summary (Addendum)
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 [facility], [address] [place], [place] [address]    Culture   Final    NO GROWTH 3 DAYS Performed at [facility], [address], [place], [place] [address]    Report Status PENDING  Incomplete  Blood culture (routine x 2)     Status: None  (Preliminary result)   Collection Time: 03/20/20  8:32 PM   Specimen: BLOOD  Result Value Ref Range Status   Specimen Description   Final    BLOOD LEFT ANTECUBITAL Performed at [facility], [address] [place], [place] [address]    Special Requests   Final    BOTTLES DRAWN AEROBIC AND ANAEROBIC Blood Culture adequate volume Performed at [facility], [address], [place], [place] [address]    Culture   Final    NO GROWTH 3 DAYS Performed at [facility], [address], [place], [place] [address]    Report Status PENDING  Incomplete  Urine Culture     Status: Abnormal   Collection Time: 03/20/20  8:43 PM   Specimen: Urine, Clean Catch  Result Value Ref Range Status   Specimen Description   Final    URINE, CLEAN CATCH Performed at [facility], [address] [place], [place] [address]    Special Requests   Final    NONE Performed at  [facility], [address] [place], [place] [address]    Culture (A)  Final    <10,000 COLONIES/mL INSIGNIFICANT GROWTH Performed at [facility], [address] [place], [place] [address]    Report Status 03/22/2020 FINAL  Final  SARS Coronavirus 2 by RT PCR (hospital order, performed in [facility] hospital lab) Nasopharyngeal Nasopharyngeal Swab     Status: None   Collection Time: 03/20/20  8:54 PM  Specimen: Nasopharyngeal Swab  Result Value Ref Range Status   SARS Coronavirus 2 NEGATIVE NEGATIVE Final    Comment: (NOTE) SARS-CoV-2 target nucleic acids are NOT DETECTED.  The SARS-CoV-2 RNA is generally detectable in upper and lower respiratory specimens during the acute phase of infection. The lowest concentration of SARS-CoV-2 viral copies this assay can detect is 250 copies / mL. A negative result does not preclude SARS-CoV-2 infection and should not be used as the sole basis for treatment or other patient management decisions.  A  negative result may occur with improper specimen collection / handling, submission of specimen other than nasopharyngeal swab, presence of viral mutation(s) within the areas targeted by this assay, and inadequate number of viral copies (<250 copies / mL). A negative result must be combined with clinical observations, patient history, and epidemiological information.  Fact Sheet for Patients:   BoilerBrush.com.cyhttps://www.fda.gov/media/136312/download  Fact Sheet for Healthcare Providers: https://pope.com/https://www.fda.gov/media/136313/download  This test is not yet approved or  cleared by the [place] FDA and has been authorized for detection and/or diagnosis of SARS-CoV-2 by FDA under an Emergency Use Authorization (EUA).  This EUA will remain in effect (meaning this test can be used) for the duration of the COVID-19 declaration under Section 564(b)(1) of the Act, 21 U.S.C. section 360bbb-3(b)(1), unless the authorization is terminated or revoked sooner.  Performed at [facility], [address] [place], [place] [address]   Aerobic/Anaerobic Culture (surgical/deep wound)     Status: Abnormal (Preliminary result)   Collection Time: 03/21/20  3:11 PM   Specimen: PATH Other; Tissue  Result Value Ref Range Status   Specimen Description   Final    ABSCESS RIGHT GROIN PERINEAL Performed at [facility], [address] [place], [place] [address]    Special Requests   Final    NONE Performed at [facility], [address] [place], [place] [address]    Gram Stain   Final    RARE WBC PRESENT, PREDOMINANTLY PMN ABUNDANT GRAM POSITIVE COCCI ABUNDANT GRAM NEGATIVE RODS FEW GRAM POSITIVE RODS Performed at [facility], [address] [place], [place] [address]    Culture (A)  Final    MULTIPLE ORGANISMS PRESENT, NONE PREDOMINANT NO ANAEROBES ISOLATED; CULTURE IN PROGRESS FOR 5 DAYS    Report Status PENDING  Incomplete     Time  coordinating discharge: 32 minutes.   SIGNED:   Kathlen ModyVijaya Markita Stcharles, MD  [facility] 03/24/2020, 8:08 PM

## 2020-03-26 LAB — AEROBIC/ANAEROBIC CULTURE W GRAM STAIN (SURGICAL/DEEP WOUND)

## 2020-03-26 LAB — CULTURE, BLOOD (ROUTINE X 2)
Culture: NO GROWTH
Culture: NO GROWTH
Special Requests: ADEQUATE
Special Requests: ADEQUATE

## 2020-04-13 ENCOUNTER — Inpatient Hospital Stay: Payer: Self-pay | Admitting: Physician Assistant

## 2020-04-18 ENCOUNTER — Institutional Professional Consult (permissible substitution): Payer: Self-pay | Admitting: Internal Medicine

## 2020-04-20 ENCOUNTER — Other Ambulatory Visit: Payer: Self-pay

## 2020-04-20 ENCOUNTER — Encounter: Payer: Self-pay | Admitting: Plastic Surgery

## 2020-04-20 ENCOUNTER — Ambulatory Visit (INDEPENDENT_AMBULATORY_CARE_PROVIDER_SITE_OTHER): Payer: Self-pay | Admitting: Plastic Surgery

## 2020-04-20 VITALS — BP 125/73 | HR 95 | Temp 98.5°F | Ht 69.0 in | Wt 186.4 lb

## 2020-04-20 DIAGNOSIS — L02215 Cutaneous abscess of perineum: Secondary | ICD-10-CM

## 2020-04-20 MED FILL — TAMSULOSIN HCL 0.4 MG CAP: 0.4 | 30 days supply | Qty: 30 | Fill #1

## 2020-04-20 MED FILL — LEVOTHYROXINE SODIUM 112 MC: 112 | 30 days supply | Qty: 30 | Fill #1

## 2020-04-20 MED FILL — ATORVASTATIN CALCIUM 40 MG: 40 | 30 days supply | Qty: 30 | Fill #1
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
## 2020-04-25 ENCOUNTER — Encounter: Payer: Self-pay | Admitting: Medical

## 2020-04-25 ENCOUNTER — Ambulatory Visit (INDEPENDENT_AMBULATORY_CARE_PROVIDER_SITE_OTHER): Payer: Self-pay | Admitting: Medical

## 2020-04-25 ENCOUNTER — Other Ambulatory Visit: Payer: Self-pay

## 2020-04-25 VITALS — BP 128/82 | HR 90 | Ht 69.0 in | Wt 183.4 lb

## 2020-04-25 DIAGNOSIS — R0789 Other chest pain: Secondary | ICD-10-CM

## 2020-04-25 DIAGNOSIS — E785 Hyperlipidemia, unspecified: Secondary | ICD-10-CM

## 2020-04-25 DIAGNOSIS — Z87891 Personal history of nicotine dependence: Secondary | ICD-10-CM

## 2020-04-25 DIAGNOSIS — Z79899 Other long term (current) drug therapy: Secondary | ICD-10-CM

## 2020-04-25 DIAGNOSIS — E119 Type 2 diabetes mellitus without complications: Secondary | ICD-10-CM

## 2020-04-25 MED ORDER — ATORVASTATIN CALCIUM 40 MG PO TABS
40.0000 mg | ORAL_TABLET | Freq: Every day | ORAL | 11 refills | Status: DC
Start: 2020-04-25 — End: 2020-05-10

## 2020-04-25 NOTE — Patient Instructions (Signed)
Medication Instructions:  Continue current medications  *If you need a refill on your cardiac medications before your next appointment, please call your pharmacy*   Lab Work: None Ordered   Testing/Procedures: None Ordered   Follow-Up: At [facility], you and your health needs are our priority.  As part of our continuing mission to provide you with exceptional heart care, we have created designated Provider Care Teams.  These Care Teams include your primary Cardiologist (physician) and Advanced Practice Providers (APPs -  Physician Assistants and Nurse Practitioners) who all work together to provide you with the care you need, when you need it.  We recommend signing up for the patient portal called "MyChart".  Sign up information is provided on this After Visit Summary.  MyChart is used to connect with patients for Virtual Visits (Telemedicine).  Patients are able to view lab/test results, encounter notes, upcoming appointments, etc.  Non-urgent messages can be sent to your provider as well.   To learn more about what you can do with MyChart, go to https://www.mychart.com.    Your next appointment:   1 year(s)  The format for your next appointment:   In Person  Provider:   You may see Brian Crenshaw, MD or one of the following Advanced Practice Providers on your designated Care Team:    Luke Kilroy, PA-C  Callie Goodrich, PA-C  Jesse Cleaver, FNP      

## 2020-04-28 ENCOUNTER — Telehealth (HOSPITAL_COMMUNITY): Payer: Self-pay | Admitting: Licensed Clinical Social Worker
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
## 2020-05-04 ENCOUNTER — Telehealth: Payer: Self-pay | Admitting: Licensed Clinical Social Worker

## 2020-05-04 ENCOUNTER — Telehealth (HOSPITAL_COMMUNITY): Payer: Self-pay | Admitting: Emergency Medicine

## 2020-05-04 NOTE — Telephone Encounter (Signed)
Reaching out to patient to offer assistance regarding upcoming cardiac imaging study; pt verbalizes understanding of appt date/time, parking situation and where to check in, pre-test NPO status and medications ordered, and verified current allergies; name and call back number provided for further questions should they arise Payton Prinsen RN Navigator Cardiac [facility] [phone] office [phone] cell 

## 2020-05-04 NOTE — Telephone Encounter (Addendum)
CSW called to ensure pt has transportation to his appointment 11/5 for CT and to provide updated HOPE Program information. No answer at [phone]. HIPAA compliant message left.   Octavio Graves, MSW, LCSW [facility] Clinical Social Work

## 2020-05-06 ENCOUNTER — Other Ambulatory Visit: Payer: Self-pay

## 2020-05-06 ENCOUNTER — Ambulatory Visit (HOSPITAL_COMMUNITY)
Admission: RE | Admit: 2020-05-06 | Discharge: 2020-05-06 | Disposition: A | Discharge status: Home / Self Care | Payer: Self-pay | Source: Ambulatory Visit | Attending: Medical | Admitting: Medical

## 2020-05-06 DIAGNOSIS — R072 Precordial pain: Secondary | ICD-10-CM

## 2020-05-06 LAB — POCT I-STAT CREATININE: Creatinine, Ser: 1.1 mg/dL (ref 0.61–1.24)

## 2020-05-06 MED ORDER — METOPROLOL TARTRATE 5 MG/5ML IV SOLN
INTRAVENOUS | Status: AC
  Administered 2020-05-06: 5 mg via INTRAVENOUS
  Filled 2020-05-06: qty 10

## 2020-05-06 MED ORDER — METOPROLOL TARTRATE 5 MG/5ML IV SOLN: 5.0000 mg | INTRAVENOUS | Status: DC | PRN

## 2020-05-06 MED ORDER — NITROGLYCERIN 0.4 MG SL SUBL
0.8000 mg | SUBLINGUAL_TABLET | Freq: Once | SUBLINGUAL | Status: AC
  Administered 2020-05-06: 0.8 mg via SUBLINGUAL

## 2020-05-06 MED ORDER — NITROGLYCERIN 0.4 MG SL SUBL
SUBLINGUAL_TABLET | SUBLINGUAL | Status: AC
  Filled 2020-05-06: qty 2

## 2020-05-06 MED ORDER — IOHEXOL 350 MG/ML SOLN
80.0000 mL | Freq: Once | INTRAVENOUS | Status: AC | PRN
  Administered 2020-05-06: 80 mL via INTRAVENOUS

## 2020-05-08 NOTE — Progress Notes (Signed)
Patient is a 54 year old male who had a perineal abscess that was drained by Dr. Ezzard Standing on 03/21/20 while patient was in the hospital.  He was sent to our office for wound care instructions and follow-up.  At last visit on 10/20 his blood sugars were well controlled since he was out of the hospital.  Perineal wound was approximately 10 cm by less than 1 cm in width.  There was no fluctuance or surrounding drainage suggestive of infection.  Since that visit he has been showering twice a day to keep the area clean and covering the wound with nonstick gauze.  Today patient reports he is doing well.  Denies fever/chills, nausea/vomiting.  Wound has shown significant improvement (2 x 0.4 cm) with healthy pink tissue present.  No signs of infection.  Continue showering to keep the area clean covering wound with nonstick gauze.  Provided return precautions.  Follow-up in 3 to 4 weeks.  Call office with any questions/concerns.

## 2020-05-09 ENCOUNTER — Telehealth: Payer: Self-pay

## 2020-05-09 ENCOUNTER — Telehealth: Payer: Self-pay | Admitting: Licensed Clinical Social Worker

## 2020-05-09 NOTE — Telephone Encounter (Addendum)
Left a voice message for the patient to give our office a call to get his results.   ----- Message from Beatriz Stallion, PA-C sent at 05/09/2020  8:12 AM EST ----- Please notify the patient that the CT scan of the arteries around his heart did not show any narrowing or blockages - his arteries appeared normal. He should continue his current medications as prescribed to prevent heart disease in the future. Thank you!

## 2020-05-09 NOTE — Telephone Encounter (Signed)
CSW called and spoke with pt via telephone. Introduced self, role, reason for call.  Pt was reminded of importance of making his PCP appointment tomorrow at [facility]. Pt familiar with the office as he has picked up perscriptions there. He shared that he does have a ride there and to his appointment on Wednesday as well.   I encouraged him that if he finds in the morning he ends up not having a ride to his appointments that he should call the office and let us know so we can get a ride scheduled for him.  Octavio Graves, MSW, LCSW [facility] Clinical Social Work

## 2020-05-10 ENCOUNTER — Ambulatory Visit: Payer: Self-pay | Attending: Family | Admitting: Family

## 2020-05-10 ENCOUNTER — Other Ambulatory Visit: Payer: Self-pay

## 2020-05-10 ENCOUNTER — Other Ambulatory Visit: Payer: Self-pay | Admitting: Family

## 2020-05-10 ENCOUNTER — Encounter: Payer: Self-pay | Admitting: Family

## 2020-05-10 VITALS — BP 129/85 | HR 97 | Resp 16 | Ht 69.0 in | Wt 179.2 lb

## 2020-05-10 DIAGNOSIS — R0789 Other chest pain: Secondary | ICD-10-CM

## 2020-05-10 DIAGNOSIS — Z1211 Encounter for screening for malignant neoplasm of colon: Secondary | ICD-10-CM

## 2020-05-10 DIAGNOSIS — Z599 Problem related to housing and economic circumstances, unspecified: Secondary | ICD-10-CM

## 2020-05-10 DIAGNOSIS — L02214 Cutaneous abscess of groin: Secondary | ICD-10-CM

## 2020-05-10 DIAGNOSIS — Z1159 Encounter for screening for other viral diseases: Secondary | ICD-10-CM

## 2020-05-10 DIAGNOSIS — Z7689 Persons encountering health services in other specified circumstances: Secondary | ICD-10-CM

## 2020-05-10 DIAGNOSIS — L02215 Cutaneous abscess of perineum: Secondary | ICD-10-CM

## 2020-05-10 DIAGNOSIS — E876 Hypokalemia: Secondary | ICD-10-CM

## 2020-05-10 DIAGNOSIS — Z09 Encounter for follow-up examination after completed treatment for conditions other than malignant neoplasm: Secondary | ICD-10-CM

## 2020-05-10 DIAGNOSIS — Z720 Tobacco use: Secondary | ICD-10-CM

## 2020-05-10 DIAGNOSIS — Z2821 Immunization not carried out because of patient refusal: Secondary | ICD-10-CM

## 2020-05-10 DIAGNOSIS — E785 Hyperlipidemia, unspecified: Secondary | ICD-10-CM

## 2020-05-10 DIAGNOSIS — Z23 Encounter for immunization: Secondary | ICD-10-CM

## 2020-05-10 DIAGNOSIS — E1169 Type 2 diabetes mellitus with other specified complication: Secondary | ICD-10-CM

## 2020-05-10 DIAGNOSIS — E039 Hypothyroidism, unspecified: Secondary | ICD-10-CM

## 2020-05-10 MED ORDER — INSULIN ASPART 100 UNIT/ML FLEXPEN: PEN_INJECTOR | SUBCUTANEOUS | 3 refills | Status: DC

## 2020-05-10 MED ORDER — INSULIN SYRINGES (DISPOSABLE) U-100 0.5 ML MISC: 3 refills | Status: DC

## 2020-05-10 MED ORDER — ATORVASTATIN CALCIUM 40 MG PO TABS: 40.0000 mg | ORAL_TABLET | Freq: Every day | ORAL | 2 refills | Status: DC

## 2020-05-10 MED ORDER — METFORMIN HCL 500 MG PO TABS: 500.0000 mg | ORAL_TABLET | Freq: Two times a day (BID) | ORAL | 2 refills | Status: DC

## 2020-05-10 MED ORDER — LEVOTHYROXINE SODIUM 112 MCG PO TABS: 112.0000 ug | ORAL_TABLET | Freq: Every day | ORAL | 2 refills | Status: DC

## 2020-05-10 MED ORDER — GABAPENTIN 300 MG PO CAPS: 300.0000 mg | ORAL_CAPSULE | Freq: Every day | ORAL | 0 refills | Status: DC

## 2020-05-10 MED ORDER — INSULIN GLARGINE 100 UNIT/ML SOLOSTAR PEN: 35.0000 [IU] | PEN_INJECTOR | Freq: Every day | SUBCUTANEOUS | 3 refills | Status: DC

## 2020-05-10 MED FILL — GABAPENTIN 300 MG CAPSULE: 300 | 30 days supply | Qty: 30 | Fill #0

## 2020-05-10 NOTE — Progress Notes (Addendum)
Patient ID: Kevin Oneill, male    DOB: December 14, 1965  MRN: [id number]  CC: Hospital Follow-Up  Subjective: Kevin Oneill is a 54 y.o. male with history of type 2 diabetes mellitus with hyperlipidemia, hypothyroidism, groin abscess, perineal abscess, precordial pain, chest pain, tobacco use, and hypokalemia who presents for hospital follow-up.   03/20/2020 - 03/23/2018 patient admitted to the [facility]: 1. RIGHT GROIN ABSCESS/RIGHT PERINEAL ABSCESS WITH SURROUNDING SEVERE NONPURULENT CELLULITIS: - Presented with right groin pain, CT done concerning for right perineal abscess. Patient seen by General Surgery and patient status post irrigation and debridement right groin abscess. Cultures show multiple organisms. He was started on IV Vancomycin, IV Zosyn, transitioned to oral Augmentin to complete the course. Recommended outpatient follow-up with General Surgery.  2. CHEST PAIN: - Presented with chest pain with atypical features however patient with multiple risk factors of poorly controlled diabetes, hyperlipidemia, hypothyroidism, ongoing tobacco use. Patient currently chest pain free. EKG with no ischemic changes noted. Cardiac enzymes that were cycled negative. 2D echo with EF of 60 to 65%, no wall motion abnormalities. Due to multiple cardiac risk factors Cardiology was consulted and patient was seen in consultation. Cardiology recommending outpatient CTA to exclude coronary disease once patient has been discharged from his perineal abscess with outpatient follow-up with APP in 2 to 4 weeks. Continue Aspirin and Lipitor.   3. HYPOTHYROIDISM: - TSH noted to be elevated 33.277. Patient noted to have not taken any medications in 2 years. Patient's home regimen of Synthroid has been resumed. Will need repeat thyroid function studies done in about 4 to 6 weeks. Outpatient follow-up with PCP.   4. TOBACCO USE: - Tobacco cessation.  5. HYPERLIPIDEMIA: - Fasting lipid panel with LDL of  158. Continue Lipitor.   6. DIABETES MELLITUS: - Well-controlled  7. HYPOKALEMIA: - Potassium at 3.4. Replete. Magnesium at 2.0.    HOSPITAL FOLLOW UP 05/10/2020: Time since discharge: 48 days Hospital/facility: [facility] Diagnosis: groin abscess, perineal abscess, precordial pain, type 2 diabetes mellitus with hyperlipidemia, hypothyroidism, chest pain, tobacco use, hypokalemia Procedures/tests: blood culture x 2; aerobic/amaerobic culture; T3, free; EKG; TR free; CBG; troponin I; BMP; CBC with differential; urine culture; magnesium; echocardiogram; HIV antibody; lipid panel; TSH; CT abdomen and pelvis with contrast; CT angio chest PE w and/or wo contrast; SARS Coronavirus 2 by PCR; urinalysis with reflex microscopic; CMP; lactic acid; diagnostic chest x-ray; diagnostic right knee x-ray Consultants: Cardiology New medications: Amoxicillin-Clavulanate; Aspirin; Atorvastatin; Diphenhydramine; insulin aspart; insulin glargine; Levothyroxine; Metformin; Metoprolol Tartrate (take 1 tablet by mouth for once dose to be taken on the day of Coronary CTA); Nitroglycerin; Oxycodone; Pantoprazole; Tamsulosin Discharge instructions: follow-up Jermyn 04/13/2020; follow-up Cardiology 04/25/2020; follow-up General Surgery as needed; Wound follow-up in 2 weeks; low sodium heart healthy diet, increase activity slowly Status: stable   1. RIGHT GROIN ABSCESS/RIGHT PERINEAL ABSCESS WITH SURROUNDING SEVERE NONPURULENT CELLULITIS FOLLOW-UP: 04/20/2020: Visit with Plastic Surgery physician assistant Maxwell Caul. Wound small enough that it would be difficult to pack. Recommendation for a nonstick gauze to be kept in place with either underwear or mesh underwear. Recommendation for shower twice a day to keep area clean. Follow-up in 3 weeks.  05/10/2020: Reports right thigh and groin area still tender with some pain. Scheduled to follow-up with Plastic Surgery  tomorrow.  2. CHEST PAIN FOLLOW-UP:  05/10/2020: Denies.   3. HYPOTHYROIDISM FOLLOW-UP: 05/10/2020: Medication side effects: no Medication compliance: good compliance   4. TOBACCO USE FOLLOW-UP: 05/10/2020:  Smoked for 20 years and quit April 2021.   5. HYPERLIPIDEMIA FOLLOW-UP: 05/10/2020:  Med Adherence: [x]  Yes    []  No Medication side effects: []  Yes    [x]  No Muscle aches:  []  Yes    [x]  No  6. DIABETES TYPE 2 FOLLOW-UP: Last A1C: 10.0% on 03/21/2020 Med Adherence:  []  Yes    [x]  No, has not been taking insulin sliding scale because he did not have any pen needles. Only taking Lantus when he feels like his sugar is high.  Medication side effects:  []  Yes    [x]  No Home Monitoring?  [x]  Yes. sometimes Home glucose results range: none Diet Adherence: [x]  Yes    []  No Exercise: []  Yes    [x]  No  Numbness of the feet? [x]  Yes    []  No Retinopathy hx? []  Yes    [x]  No Last eye exam: 3 years ago  ESTABLISH CARE: PAST HISTORY:   Denies mental health illness history.  Health maintenance: due for Tdap vaccine, pneumonia vaccine, and influenza vaccine and declines at this time.   Alcohol use: denies  Illicit substance use: denies  PRESENT ILLNESS:  Patient Active Problem List   Diagnosis Date Noted  . Groin abscess 03/21/2020  . Perineal abscess   . Precordial pain   . Type 2 diabetes mellitus with hyperlipidemia ([place])   . Hypothyroidism   . Chest pain   . Tobacco abuse   . Hypokalemia      Current Outpatient Medications on File Prior to Visit  Medication Sig Dispense Refill  . aspirin 81 MG EC tablet Take 1 tablet (81 mg total) by mouth daily. Swallow whole. 30 tablet 11  . diphenhydrAMINE (BENADRYL) 25 MG tablet Take 25 mg by mouth every 6 (six) hours as needed.    . metoprolol tartrate (LOPRESSOR) 100 MG tablet Take 1 tablet (100 mg total) by mouth once for 1 dose. To be taken on the day of your CT scan of your heart (Coronary CTA) as instructed by the CT  technician. 1 tablet 0  . nitroGLYCERIN (NITROSTAT) 0.4 MG SL tablet Place 1 tablet (0.4 mg total) under the tongue every 5 (five) minutes as needed for chest pain. 30 tablet 12  . oxyCODONE (OXY IR/ROXICODONE) 5 MG immediate release tablet Take 1-2 tablets (5-10 mg total) by mouth every 4 (four) hours as needed for moderate pain. 30 tablet 0  . pantoprazole (PROTONIX) 40 MG tablet Take 1 tablet (40 mg total) by mouth daily at 6 (six) AM. 30 tablet 0  . tamsulosin (FLOMAX) 0.4 MG CAPS capsule Take 1 capsule (0.4 mg total) by mouth daily. 30 capsule 1   No current facility-administered medications on file prior to visit.    Allergies  Allergen Reactions  . Bee Venom Anaphylaxis  Allergies, pollen  Social History   Socioeconomic History  . Marital status: Single    Spouse name: Not on file  . Number of children: Not on file  . Years of education: Not on file  . Highest education level: Not on file  Occupational History  . Not on file  Tobacco Use  . Smoking status: Former Smoker    Packs/day: 1.00    Types: Cigarettes    Quit date: 10/20/2019    Years since quitting: 0.5  . Smokeless tobacco: Never Used  Vaping Use  . Vaping Use: Never used  Substance and Sexual Activity  . Alcohol use: Yes    Comment: occassional  .  Drug use: No  . Sexual activity: Not on file  Other Topics Concern  . Not on file  Social History Narrative  . Not on file   Social Determinants of Health   Financial Resource Strain: High Risk  . Difficulty of Paying Living Expenses: Very hard  Food Insecurity: Food Insecurity Present  . Worried About Charity fundraiser in the Last Year: Sometimes true  . Ran Out of Food in the Last Year: Never true  Transportation Needs: No Transportation Needs  . Lack of Transportation (Medical): No  . Lack of Transportation (Non-Medical): No  Physical Activity:   . Days of Exercise per Week: Not on file  . Minutes of Exercise per Session: Not on file  Stress:    . Feeling of Stress : Not on file  Social Connections:   . Frequency of Communication with Friends and Family: Not on file  . Frequency of Social Gatherings with Friends and Family: Not on file  . Attends Religious Services: Not on file  . Active Member of Clubs or Organizations: Not on file  . Attends Archivist Meetings: Not on file  . Marital Status: Not on file  Intimate Partner Violence:   . Fear of Current or Ex-Partner: Not on file  . Emotionally Abused: Not on file  . Physically Abused: Not on file  . Sexually Abused: Not on file   Occupation: unemployed  Highest level of schooling: 11th grade  Denies special requests for religious and spiritual beliefs  FAMILY HISTORY: Family History  Problem Relation Age of Onset  . Diabetes Mother   . High blood pressure Mother   - Maternal grandmother diabetes  Past Surgical History:  Procedure Laterality Date  . IRRIGATION AND DEBRIDEMENT ABSCESS Right 03/21/2020   Procedure: IRRIGATION AND DEBRIDEMENT IRIGHT GROIN ABSCESS;  Surgeon: Alphonsa Overall, MD;  Location: [facility];  Service: General;  Laterality: Right;    ROS: Review of Systems Negative except as stated above  PHYSICAL EXAM: BP 129/85   Pulse 97   Resp 16   Ht 5' 9"  (1.753 m)   Wt 179 lb 3.2 oz (81.3 kg)   SpO2 98%   BMI 26.46 kg/m   Physical Exam General appearance - alert, well appearing, and in no distress and oriented to person, place, and time Mental status - alert, oriented to person, place, and time, normal mood, behavior, speech, dress, motor activity, and thought processes Neck - supple, no significant adenopathy Lymphatics - no palpable lymphadenopathy, no hepatosplenomegaly Chest - clear to auscultation, no wheezes, rales or rhonchi, symmetric air entry, no tachypnea, retractions or cyanosis Heart - normal rate, regular rhythm, normal S1, S2, no murmurs, rubs, clicks or gallops GU Male - patient declined Neurological - alert, oriented,  normal speech, no focal findings or movement disorder noted, motor and sensory grossly normal bilaterally, normal muscle tone, no tremors, strength 5/5 Musculoskeletal - no joint tenderness, deformity or swelling, no muscular tenderness noted Extremities - peripheral pulses normal, no pedal edema, no clubbing or cyanosis Skin - normal coloration and turgor, no rashes, no suspicious skin lesions noted Diabetic foot exam was performed with the following findings:   Intact posterior tibialis and dorsalis pedis pulses Bilateral foot dryness with flaking and scaly skin and no deformities and no ulcerations. Bilateral foot thickened toenails. Bilateral decreased sensation of plantar aspect of feet.     ASSESSMENT AND PLAN: 1. Hospital discharge follow-up: - Stable  2. Encounter to establish care: - Establish care  today. - Follow-up with primary physician in 3 months or sooner if needed for management of chronic conditions.  3. Type 2 diabetes mellitus with hyperlipidemia ([place]): - Begin taking Metformin, Lantus, and Novolog as prescribed.  - Reports he has not been taking Novolog insulin because he did not have pen needles to do so. Reports taking Lantus only when he feels like his blood sugars are high. - Last hemoglobin A1C obtained 9/2/02021 and was 10.0% at that time. - To achieve an A1C goal of less than or equal to 7.0 percent, a fasting blood sugar of 80 to 130 mg/dL and a postprandial glucose (90 to 120 minutes after a meal) less than 180 mg/dL. In the event of sugars less than 60 mg/dl or greater than 400 mg/dl please notify the clinic ASAP. It is recommended that you undergo annual eye exams and annual foot exams. - Discussed the importance of healthy eating habits, low-carbohydrate diet, low-sugar diet, regular aerobic exercise (at least 150 minutes a week as tolerated) and medication compliance to achieve or maintain control of diabetes. - Adding Gabapentin for diabetic neuropathy of the  feet. - Microalbumin / creatinine urine ratio to check kidney function. - Referral to Ophthalmology for diabetic eye exam. -Follow-up with clinical pharmacist in 4 weeks for diabetes checkup. Write your home blood sugar results down each day and bring those results to your appointment along with your home glucose monitor. Medications may be adjusted at that time if needed.  - Follow-up with primary physician in 3 months or sooner if needed. - Microalbumin / creatinine urine ratio - insulin aspart (NOVOLOG) 100 UNIT/ML FlexPen; CBG 70 - 120: 0 units CBG 121 - 150: 3 units CBG 151 - 200: 4 units CBG 201 - 250: 7 units CBG 251 - 300: 11 units CBG 301 - 350: 15 units CBG 351 - 400: 20 units  Dispense: 15 mL; Refill: 3 - insulin glargine (LANTUS) 100 UNIT/ML Solostar Pen; Inject 35 Units into the skin daily.  Dispense: 15 mL; Refill: 3 - Insulin Syringes, Disposable, U-100 0.5 ML MISC; Use daily.  Dispense: 100 each; Refill: 3 - metFORMIN (GLUCOPHAGE) 500 MG tablet; Take 1 tablet (500 mg total) by mouth 2 (two) times daily with a meal.  Dispense: 60 tablet; Refill: 2 - gabapentin (NEURONTIN) 300 MG capsule; Take 1 capsule (300 mg total) by mouth at bedtime.  Dispense: 90 capsule; Refill: 0 - Blood Glucose Monitoring Suppl (TRUE METRIX Oneill) w/Device KIT; Use as directed  Dispense: 1 kit; Refill: 0 - glucose blood (TRUE METRIX BLOOD GLUCOSE TEST) test strip; Use as instructed  Dispense: 100 each; Refill: 12 - TRUEplus Lancets 28G MISC; Use as directed  Dispense: 100 each; Refill: 4 - Ambulatory referral to Ophthalmology - Amb Referral to Clinical Pharmacist   4. Hyperlipidemia, unspecified hyperlipidemia type: - Practice low-fat heart healthy diet and at least 150 minutes of moderate intensity exercise weekly as tolerated.  - Continue Atorvastatin as prescribed. - Lipid panel to check cholesterol levels. - Hepatic function panel to check liver. - Follow-up with primary physician in 3 months or  sooner if needed. - Lipid panel - Hepatic Function Panel - atorvastatin (LIPITOR) 40 MG tablet; Take 1 tablet (40 mg total) by mouth daily.  Dispense: 30 tablet; Refill: 2  5. Hypothyroidism, unspecified type: - Continue Levothyroxine as prescribed.  - TSH, T4F, T3Free to check thyroid function. - Follow-up with primary physician in 3 months or sooner if needed. - TSH+T4F+T3Free - levothyroxine (SYNTHROID) 112 MCG  tablet; Take 1 tablet (112 mcg total) by mouth daily before breakfast.  Dispense: 30 tablet; Refill: 2  6. Atypical chest pain: - Denies any recent chest pain and denies chest pain during today's visit.  7. Perineal abscess: 8. Groin abscess: Visit with Plastic Surgery on 04/20/2020. Wound small enough that it would be difficult to pack. Recommendation for a nonstick gauze to be kept in place with either underwear or mesh underwear. Recommendation for shower twice a day to keep area clean. Follow-up in 3 weeks.  - Today patient reports has right groin and right thigh tenderness. Reports he has follow-up appointment scheduled for tomorrow.  - Keep all appointments with Plastic Surgery.   9. Hypokalemia: - BMP to check kidney function and electrolyte balance.  - Basic Metabolic Panel  10. Tobacco abuse: - Smoked for 20 years and quit April 2021.  11. Need for prophylactic vaccination with Streptococcus pneumoniae (Pneumococcus) and Influenza vaccines: - Patient declined.  12. Colon cancer screening: - Referral to Gastroenterology for colon cancer screening by colonoscopy. - Ambulatory referral to Gastroenterology  13. Need for hepatitis C screening test: - Will obtained Hepatitis C lab today. - Hepatitis C Antibody  14. Financial difficulties: - Offered patient [place] financial assistance/orange card. Counseled he will need to meet with financial counselor for processing of this documentation. Patient agreeable. - Needs financial assistance in relation to paying  for household utilities.  - Referral to Social Work for counseling and community resources.  - Ambulatory referral to Social Work  Patient was given the opportunity to ask questions.  Patient verbalized understanding of the plan and was able to repeat key elements of the plan. Patient was given clear instructions to go to Emergency Department or return to medical center if symptoms don't improve, worsen, or new problems develop.The patient verbalized understanding.   Orders Placed This Encounter  Procedures  . TSH+T4F+T3Free  . Basic Metabolic Panel  . Microalbumin / creatinine urine ratio  . Hepatitis C Antibody  . Lipid panel  . Hepatic Function Panel  . Ambulatory referral to Ophthalmology  . Ambulatory referral to Gastroenterology  . Amb Referral to Clinical Pharmacist  . Ambulatory referral to Social Work     Requested Prescriptions   Signed Prescriptions Disp Refills  . gabapentin (NEURONTIN) 300 MG capsule 90 capsule 0    Sig: Take 1 capsule (300 mg total) by mouth at bedtime.  Marland Kitchen levothyroxine (SYNTHROID) 112 MCG tablet 30 tablet 2    Sig: Take 1 tablet (112 mcg total) by mouth daily before breakfast.  . atorvastatin (LIPITOR) 40 MG tablet 30 tablet 2    Sig: Take 1 tablet (40 mg total) by mouth daily.  . insulin aspart (NOVOLOG) 100 UNIT/ML FlexPen 15 mL 3    Sig: CBG 70 - 120: 0 units CBG 121 - 150: 3 units CBG 151 - 200: 4 units CBG 201 - 250: 7 units CBG 251 - 300: 11 units CBG 301 - 350: 15 units CBG 351 - 400: 20 units  . insulin glargine (LANTUS) 100 UNIT/ML Solostar Pen 15 mL 3    Sig: Inject 35 Units into the skin daily.  . Insulin Syringes, Disposable, U-100 0.5 ML MISC 100 each 3    Sig: Use daily.  . metFORMIN (GLUCOPHAGE) 500 MG tablet 60 tablet 2    Sig: Take 1 tablet (500 mg total) by mouth 2 (two) times daily with a meal.    Return in about 3 months (around 08/10/2020) for with  any primary physician and 4 weeks with Lurena Joiner.  Camillia Herter, NP

## 2020-05-10 NOTE — Patient Instructions (Addendum)
Follow-up with pharmacist in 4 weeks for diabetes check-up. Begin taking insulin sliding scale and Lantus insulin as prescribed.  Follow-up with primary provider in 3 months for management of chronic conditions.   Keep all appointments with Cardiology and Wound Care.   Lab today.   Referral for colonoscopy.   Referral for diabetic eye exam.   Referral to Social Work.   Apply for [facility] financial assistance/orange card.  Diabetes Basics  Diabetes (diabetes mellitus) is a long-term (chronic) disease. It occurs when the body does not properly use sugar (glucose) that is released from food after you eat. Diabetes may be caused by one or both of these problems:  Your pancreas does not make enough of a hormone called insulin.  Your body does not react in a normal way to insulin that it makes. Insulin lets sugars (glucose) go into cells in your body. This gives you energy. If you have diabetes, sugars cannot get into cells. This causes high blood sugar (hyperglycemia). Follow these instructions at home: How is diabetes treated? You may need to take insulin or other diabetes medicines daily to keep your blood sugar in balance. Take your diabetes medicines every day as told by your doctor. List your diabetes medicines here: Diabetes medicines  Name of medicine: ______________________________ ? Amount (dose): _______________ Time (a.m./p.m.): _______________ Notes: ___________________________________  Name of medicine: ______________________________ ? Amount (dose): _______________ Time (a.m./p.m.): _______________ Notes: ___________________________________  Name of medicine: ______________________________ ? Amount (dose): _______________ Time (a.m./p.m.): _______________ Notes: ___________________________________ If you use insulin, you will learn how to give yourself insulin by injection. You may need to adjust the amount based on the food that you eat. List the types of insulin  you use here: Insulin  Insulin type: ______________________________ ? Amount (dose): _______________ Time (a.m./p.m.): _______________ Notes: ___________________________________  Insulin type: ______________________________ ? Amount (dose): _______________ Time (a.m./p.m.): _______________ Notes: ___________________________________  Insulin type: ______________________________ ? Amount (dose): _______________ Time (a.m./p.m.): _______________ Notes: ___________________________________  Insulin type: ______________________________ ? Amount (dose): _______________ Time (a.m./p.m.): _______________ Notes: ___________________________________  Insulin type: ______________________________ ? Amount (dose): _______________ Time (a.m./p.m.): _______________ Notes: ___________________________________ How do I manage my blood sugar?  Check your blood sugar levels using a blood glucose monitor as directed by your doctor. Your doctor will set treatment goals for you. Generally, you should have these blood sugar levels:  Before meals (preprandial): 80-130 mg/dL (7.3-7.1 mmol/L).  After meals (postprandial): below 180 mg/dL (10 mmol/L).  A1c level: less than 7%. Write down the times that you will check your blood sugar levels: Blood sugar checks  Time: _______________ Notes: ___________________________________  Time: _______________ Notes: ___________________________________  Time: _______________ Notes: ___________________________________  Time: _______________ Notes: ___________________________________  Time: _______________ Notes: ___________________________________  Time: _______________ Notes: ___________________________________  What do I need to know about low blood sugar? Low blood sugar is called hypoglycemia. This is when blood sugar is at or below 70 mg/dL (3.9 mmol/L). Symptoms may include:  Feeling: ? Hungry. ? Worried or nervous (anxious). ? Sweaty and  clammy. ? Confused. ? Dizzy. ? Sleepy. ? Sick to your stomach (nauseous).  Having: ? A fast heartbeat. ? A headache. ? A change in your vision. ? Tingling or no feeling (numbness) around the mouth, lips, or tongue. ? Jerky movements that you cannot control (seizure).  Having trouble with: ? Moving (coordination). ? Sleeping. ? Passing out (fainting). ? Getting upset easily (irritability). Treating low blood sugar To treat low blood sugar, eat or drink something sugary right away. If you can  think clearly and swallow safely, follow the 15:15 rule:  Take 15 grams of a fast-acting carb (carbohydrate). Talk with your doctor about how much you should take.  Some fast-acting carbs are: ? Sugar tablets (glucose pills). Take 3-4 glucose pills. ? 6-8 pieces of hard candy. ? 4-6 oz (120-150 mL) of fruit juice. ? 4-6 oz (120-150 mL) of regular (not diet) soda. ? 1 Tbsp (15 mL) honey or sugar.  Check your blood sugar 15 minutes after you take the carb.  If your blood sugar is still at or below 70 mg/dL (3.9 mmol/L), take 15 grams of a carb again.  If your blood sugar does not go above 70 mg/dL (3.9 mmol/L) after 3 tries, get help right away.  After your blood sugar goes back to normal, eat a meal or a snack within 1 hour. Treating very low blood sugar If your blood sugar is at or below 54 mg/dL (3 mmol/L), you have very low blood sugar (severe hypoglycemia). This is an emergency. Do not wait to see if the symptoms will go away. Get medical help right away. Call your local emergency services (911 in the [place] Do not drive yourself to the hospital. Questions to ask your health care provider  Do I need to meet with a diabetes educator?  What equipment will I need to care for myself at home?  What diabetes medicines do I need? When should I take them?  How often do I need to check my blood sugar?  What number can I call if I have questions?  When is my next doctor's  visit?  Where can I find a support group for people with diabetes? Where to find more information  American Diabetes Association: www.diabetes.org  American Association of Diabetes Educators: www.diabeteseducator.org/patient-resources Contact a doctor if:  Your blood sugar is at or above 240 mg/dL (40.9 mmol/L) for 2 days in a row.  You have been sick or have had a fever for 2 days or more, and you are not getting better.  You have any of these problems for more than 6 hours: ? You cannot eat or drink. ? You feel sick to your stomach (nauseous). ? You throw up (vomit). ? You have watery poop (diarrhea). Get help right away if:  Your blood sugar is lower than 54 mg/dL (3 mmol/L).  You get confused.  You have trouble: ? Thinking clearly. ? Breathing. Summary  Diabetes (diabetes mellitus) is a long-term (chronic) disease. It occurs when the body does not properly use sugar (glucose) that is released from food after digestion.  Take insulin and diabetes medicines as told.  Check your blood sugar every day, as often as told.  Keep all follow-up visits as told by your doctor. This is important. This information is not intended to replace advice given to you by your health care provider. Make sure you discuss any questions you have with your health care provider. Document Revised: 03/11/2019 Document Reviewed: 09/20/2017 Elsevier Patient Education  2020 [employer].

## 2020-05-11 ENCOUNTER — Ambulatory Visit (INDEPENDENT_AMBULATORY_CARE_PROVIDER_SITE_OTHER): Payer: Self-pay | Admitting: Plastic Surgery

## 2020-05-11 ENCOUNTER — Other Ambulatory Visit: Payer: Self-pay

## 2020-05-11 ENCOUNTER — Encounter: Payer: Self-pay | Admitting: Plastic Surgery

## 2020-05-11 VITALS — BP 110/72 | HR 90 | Temp 98.6°F

## 2020-05-11 DIAGNOSIS — L02215 Cutaneous abscess of perineum: Secondary | ICD-10-CM

## 2020-05-11 LAB — BASIC METABOLIC PANEL
BUN/Creatinine Ratio: 12 (ref 9–20)
BUN: 12 mg/dL (ref 6–24)
CO2: 26 mmol/L (ref 20–29)
Calcium: 9.5 mg/dL (ref 8.7–10.2)
Chloride: 104 mmol/L (ref 96–106)
Creatinine, Ser: 1.04 mg/dL (ref 0.76–1.27)
GFR calc Af Amer: 94 mL/min/{1.73_m2} (ref >59)
GFR calc non Af Amer: 81 mL/min/{1.73_m2} (ref >59)
Glucose: 137 mg/dL — ABNORMAL HIGH (ref 65–99)
Potassium: 4.4 mmol/L (ref 3.5–5.2)
Sodium: 142 mmol/L (ref 134–144)

## 2020-05-11 LAB — LIPID PANEL
Chol/HDL Ratio: 3.2 ratio (ref 0.0–5.0)
Cholesterol, Total: 159 mg/dL (ref 100–199)
HDL: 50 mg/dL (ref >39)
LDL Chol Calc (NIH): 97 mg/dL (ref 0–99)
Triglycerides: 60 mg/dL (ref 0–149)
VLDL Cholesterol Cal: 12 mg/dL (ref 5–40)

## 2020-05-11 LAB — HEPATITIS C ANTIBODY: Hep C Virus Ab: <0.1 s/co ratio (ref 0.0–0.9)

## 2020-05-11 LAB — TSH+T4F+T3FREE
Free T4: 1.86 ng/dL — ABNORMAL HIGH (ref 0.82–1.77)
T3, Free: 2.8 pg/mL (ref 2.0–4.4)
TSH: 5.14 u[IU]/mL — ABNORMAL HIGH (ref 0.450–4.500)

## 2020-05-11 LAB — HEPATIC FUNCTION PANEL
ALT: 24 IU/L (ref 0–44)
AST: 15 IU/L (ref 0–40)
Albumin: 4.7 g/dL (ref 3.8–4.9)
Alkaline Phosphatase: 51 IU/L (ref 44–121)
Bilirubin Total: 0.3 mg/dL (ref 0.0–1.2)
Bilirubin, Direct: 0.13 mg/dL (ref 0.00–0.40)
Total Protein: 7.5 g/dL (ref 6.0–8.5)

## 2020-05-11 LAB — MICROALBUMIN / CREATININE URINE RATIO
Creatinine, Urine: 255.6 mg/dL
Microalb/Creat Ratio: 4 mg/g creat (ref 0–29)
Microalbumin, Urine: 9.8 ug/mL

## 2020-05-11 MED FILL — !LANTUS SOLOSTAR 100UNITS/M: 100 | 33 days supply | Qty: 12 | Fill #0

## 2020-05-11 MED FILL — TRUEPLUS SYR 0.5ML 31GX5/16: 31G X 5/16" | 30 days supply | Qty: 100 | Fill #0

## 2020-05-11 MED FILL — METFORMIN HCL 500 MG TABS: 500 | 30 days supply | Qty: 60 | Fill #0

## 2020-05-12 ENCOUNTER — Other Ambulatory Visit: Payer: Self-pay | Admitting: Family

## 2020-05-12 ENCOUNTER — Telehealth: Payer: Self-pay | Admitting: Licensed Clinical Social Worker

## 2020-05-12 MED ORDER — LEVOTHYROXINE SODIUM 125 MCG PO TABS: 125.0000 ug | ORAL_TABLET | Freq: Every day | ORAL | 2 refills | Status: DC

## 2020-05-12 MED ORDER — TRUE METRIX METER W/DEVICE KIT: PACK | 0 refills | Status: DC

## 2020-05-12 MED ORDER — TRUEPLUS LANCETS 28G MISC: 4 refills | Status: DC

## 2020-05-12 MED ORDER — TRUE METRIX BLOOD GLUCOSE TEST VI STRP: ORAL_STRIP | 12 refills | Status: DC

## 2020-05-12 MED FILL — TRUEplus LANCETS 28G MISC: 25 days supply | Qty: 100 | Fill #0

## 2020-05-12 MED FILL — TRUE METRIX TEST STRIP: 25 days supply | Qty: 100 | Fill #0

## 2020-05-12 MED FILL — !TRUE METRIX BLOOD GLUCOSE: 1 days supply | Qty: 1 | Fill #0

## 2020-05-12 NOTE — Addendum Note (Signed)
Addended by: Rema Fendt on: 05/12/2020 08:43 AM   Modules accepted: Orders

## 2020-05-12 NOTE — Addendum Note (Signed)
Addended by: Rema Fendt on: 05/12/2020 11:02 AM   Modules accepted: Orders

## 2020-05-12 NOTE — Progress Notes (Signed)
Please call patient with the following update.   Thyroid function higher than normal. Called patient to confirm that he began taking medication a little over 6 weeks ago. Prior to this had not taken medication for 2 years because he was incarcerated. Increase Levothyroxine from 112 mcg daily to 125 mg daily and prescription sent to clinic pharmacy. Repeat thyroid panel in 6 weeks.   I have already called patient with the following updates:  Kidney function normal.   Hepatitis C negative.   Cholesterol normal.  Liver function normal.   Keep all appointments with primary physician.

## 2020-05-12 NOTE — Addendum Note (Signed)
Addended by: Rema Fendt on: 05/12/2020 10:27 AM   Modules accepted: Orders

## 2020-05-12 NOTE — Telephone Encounter (Signed)
LCSW received message from Provider regarding pt's request for rent and utility assistance. LCSW placed call to patient and provided supportive resources ([employer], DSS, and [employer]) Pt was informed of partnership with Legal Aid who can also assist with eviction, if needed.   Pt appreciative for assistance and agreed to follow up with LCSW at scheduled IBH appointment.

## 2020-05-12 NOTE — Addendum Note (Signed)
Addended by: Rema Fendt on: 05/12/2020 11:06 AM   Modules accepted: Orders

## 2020-05-13 ENCOUNTER — Telehealth: Payer: Self-pay | Admitting: General Practice

## 2020-05-13 NOTE — Telephone Encounter (Signed)
Copied from CRM [id number]. Topic: General - Other >> May 13, 2020 12:44 PM Dalphine Handing A wrote: Patient is outside in parking lot and is requesting a callback as soon as possible in regards to his medications. Patient stated he was advised his medications were $10 but was told today that they were $26. Patient stated that he doesn't have the funds for medication and wants to know if there is anyway the office can assist him or he can be given any samples. Please advise

## 2020-05-25 ENCOUNTER — Ambulatory Visit: Payer: Self-pay | Attending: Family Medicine | Admitting: Licensed Clinical Social Worker

## 2020-05-25 ENCOUNTER — Other Ambulatory Visit: Payer: Self-pay

## 2020-05-25 DIAGNOSIS — F4323 Adjustment disorder with mixed anxiety and depressed mood: Secondary | ICD-10-CM

## 2020-05-25 MED FILL — ?LEVOTHYROXINE 125 MCG TABL: 125 | 30 days supply | Qty: 30 | Fill #0

## 2020-05-25 MED FILL — ?ATORVASTATIN 40MG TABLET: 40 | 30 days supply | Qty: 30 | Fill #0

## 2020-05-29 NOTE — Progress Notes (Signed)
Patient is a 54 year old male who had a perineal abscess that was drained by Dr. Ezzard Standing on 03/21/2020 while patient was in the hospital.  He was sent to our office for wound care instructions and follow-up.  At last visit on 05/11/2020 wound showed significant improvement (2 x 0.4 cm) with healthy pink tissue present.  Patient reports he has been applying Vaseline and gauze to the area. He has been very good about keeping the area clean. He reports that keeping the gauze on is very difficult due to the location so he has mostly been doing only Vaseline. He is not having any infectious symptoms. He reports that he has some pain, mostly with temperature changes and cooler temperature. He reports some burning sensation. He reports that blood sugars have been fairly well controlled.  Chaperone present on exam On exam right groin wound is 8 x 1 mm without any surrounding erythema or cellulitic changes. There is no foul odor is noted. No drainage noted. No fluctuance noted with palpation.  Recommend continuing with Vaseline dressing changes 1-2 times daily. Recommend continuing with showering twice per day to keep the area clean. Recommend covering the wound with gauze as he is able.  Recommend following up in 1 month for reevaluation.
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
## 2020-06-02 ENCOUNTER — Other Ambulatory Visit: Payer: Self-pay

## 2020-06-02 ENCOUNTER — Ambulatory Visit (INDEPENDENT_AMBULATORY_CARE_PROVIDER_SITE_OTHER): Payer: Self-pay | Admitting: Surgical

## 2020-06-02 ENCOUNTER — Encounter: Payer: Self-pay | Admitting: Plastic Surgery

## 2020-06-02 ENCOUNTER — Telehealth: Payer: Self-pay

## 2020-06-02 VITALS — BP 105/69 | HR 90 | Temp 98.6°F

## 2020-06-02 DIAGNOSIS — L02215 Cutaneous abscess of perineum: Secondary | ICD-10-CM

## 2020-06-02 NOTE — Telephone Encounter (Signed)
Copied from CRM [id number]. Topic: General - Other >> Jun 02, 2020 11:28 AM Darron Doom wrote: Reason for CRM: Patient called in to ask Jasmine to give him a call ASAP say that his landlord is trying to evict him and he need to speak to [facility] right away please call Ph# [phone]  Please advice

## 2020-06-03 ENCOUNTER — Telehealth: Payer: Self-pay | Admitting: Licensed Clinical Social Worker

## 2020-06-03 NOTE — Telephone Encounter (Signed)
Call placed to patient informing him that LCSW has left a message for both his landlord and Legal Aid. Pt shared that he has contacted two agencies that have re-directed him to DSS to apply for emergency rental and utility assistance. Pt agreed to visit DSS on Monday, December 6, 21 and will update LCSW at that time.

## 2020-06-03 NOTE — Telephone Encounter (Signed)
Return call placed to patient. Patient shared that landlord is threatening to evict family from home by 06/05/20. He has not heard from Legal Aid regarding referral that was placed on 05/25/20. Patient provided Landlord's contact information Harriett Sine Smith-[phone] requesting LCSW to disclose that family is trying to find emergency funding to assist with back rent.   Pt agreed to come to [facility] to pick up listing of agencies who are providing emergency rental assistance. LCSW left a message for Lalla Brothers, with Legal Aid, to inquire about referral.

## 2020-06-06 ENCOUNTER — Telehealth: Payer: Self-pay | Admitting: Licensed Clinical Social Worker

## 2020-06-06 NOTE — Telephone Encounter (Signed)
LCSW completed a follow up email to Legal Aid regarding referral to assist with housing needs.   A message was left for Chucky May, Mediation Coordinator, [phone] to inquire about the Eviction Mediation Program through [facility] for Housing and [employer].

## 2020-06-07 ENCOUNTER — Other Ambulatory Visit: Payer: Self-pay | Admitting: Family Medicine

## 2020-06-07 ENCOUNTER — Ambulatory Visit: Payer: Self-pay | Attending: Family Medicine | Admitting: Pharmacist

## 2020-06-07 ENCOUNTER — Other Ambulatory Visit: Payer: Self-pay

## 2020-06-07 ENCOUNTER — Encounter: Payer: Self-pay | Admitting: Pharmacist

## 2020-06-07 DIAGNOSIS — E785 Hyperlipidemia, unspecified: Secondary | ICD-10-CM

## 2020-06-07 DIAGNOSIS — E1169 Type 2 diabetes mellitus with other specified complication: Secondary | ICD-10-CM

## 2020-06-07 LAB — GLUCOSE, POCT (MANUAL RESULT ENTRY): POC Glucose: 74 mg/dl (ref 70–99)

## 2020-06-07 MED ORDER — INSULIN GLARGINE 100 UNIT/ML SOLOSTAR PEN: 24.0000 [IU] | PEN_INJECTOR | Freq: Every day | SUBCUTANEOUS | 3 refills | Status: DC

## 2020-06-07 MED ORDER — TRUEPLUS PEN NEEDLES 32G X 4 MM MISC: 2 refills | Status: DC

## 2020-06-07 MED ORDER — GABAPENTIN 300 MG PO CAPS: 300.0000 mg | ORAL_CAPSULE | Freq: Every day | ORAL | 0 refills | Status: DC

## 2020-06-07 MED FILL — TRUEplus 5-BEVEL PEN NEEDLE: 32G X 4 MM | 90 days supply | Qty: 100 | Fill #0

## 2020-06-07 MED FILL — GABAPENTIN 300 MG CAPSULE: 300 | 30 days supply | Qty: 30 | Fill #0

## 2020-06-07 NOTE — Progress Notes (Signed)
    S:    PCP: Ricky Stabs  No chief complaint on file.  Patient arrives well and in good spirits.  Presents for diabetes evaluation, education, and management. Patient was referred and last seen by Primary Care Provider on 05/10/2020. At this visit, patient reported he was not using his insulin consistently. Lantus was restarted at 35 units daily + sliding scale novolog.  Today, patient reports hypoglycemic events. Gives objective readings in the 50s. D/t this, he self titrated his insulin down to 28 units daily. Reports that blood sugars have been 80s-100s since this change. Patient expresses financial issues with limited access to food. We provided him with some canned foods in the clinic today.   Insurance coverage/medication affordability: self pay   Medication adherence reported.   Current diabetes medications include: metformin 500 mg BID, lantus 35 units daily (only taking 28 units due to lows), novolog sliding scale (not requiring)  Patient reports hypoglycemic events. See above.   Patient reports diet is limited due to lack of access to food. Patient was provided with some canned foods in clinics today. Patient reports trying to attend local food pantries.   Patient-reported exercise habits: none   Patient denies nocturia (nighttime urination).  Patient denies neuropathy (nerve pain). Patient denies visual changes. Patient reports self foot exams.     O:  Physical Exam   ROS  POCT glucose: 74  Lab Results  Component Value Date   HGBA1C 10.0 (H) 03/20/2020   There were no vitals filed for this visit.  Lipid Panel     Component Value Date/Time   CHOL 159 05/10/2020 1205   TRIG 60 05/10/2020 1205   HDL 50 05/10/2020 1205   CHOLHDL 3.2 05/10/2020 1205   CHOLHDL 6.1 03/20/2020 2004   VLDL 25 03/20/2020 2004   LDLCALC 97 05/10/2020 1205   Home fasting blood sugars: Since decreasing to 28 units of insulin: 80s - 100s (highest was 108)  Clinical  Atherosclerotic Cardiovascular Disease (ASCVD): No  The 10-year ASCVD risk score Denman George DC Jr., et al., 2013) is: 7.9%   Values used to calculate the score:     Age: 54 years     Sex: Male     Is Non-Hispanic African American: Yes     Diabetic: Yes     Tobacco smoker: No     Systolic Blood Pressure: 105 mmHg     Is BP treated: No     HDL Cholesterol: 50 mg/dL     Total Cholesterol: 159 mg/dL    A/P: Diabetes longstanding, previously uncontrolled with A1c of 10.0 in September. Patient is able to verbalize appropriate hypoglycemia management plan. Medication adherence appears appropriate. Control is suboptimal due to financial support. Will decrease Lantus further. Patient due for an A1c today.  -Continue metformin 500 mg BID.  -Decreased lantus insulin to 24 units daily. Advised patient that if blood sugars at home are less than 70, he can further reduce his lantus to 20 units daily. -A1c   -Extensively discussed pathophysiology of diabetes, recommended lifestyle interventions, dietary effects on blood sugar control -Counseled on s/sx of and management of hypoglycemia    Written patient instructions provided.  Total time in face to face counseling 15 minutes.   Follow up PCP Clinic Visit on 07/06/20.     Theodis Sato, PharmD PGY-1 [facility] Resident   06/07/2020 11:55 AM

## 2020-06-08 LAB — HEMOGLOBIN A1C
Est. average glucose Bld gHb Est-mCnc: 143 mg/dL
Hgb A1c MFr Bld: 6.6 % — ABNORMAL HIGH (ref 4.8–5.6)

## 2020-06-08 MED FILL — ?BASAGLAR 100 UNITS/ML KWPE: 100 | 24 days supply | Qty: 6 | Fill #0

## 2020-06-10 ENCOUNTER — Telehealth: Payer: Self-pay | Admitting: *Deleted

## 2020-06-10 NOTE — Telephone Encounter (Signed)
Patient requested to speak to his Social Worker Jasmine to inform that he was not able to pick up medication at the pharmacy. He was told he needed $14. Advised patient that he should speak to someone at the pharmacy to see if he could discuss other  payment options.   Informed that I would honor request regarding SW- Jasmine.

## 2020-06-14 NOTE — Telephone Encounter (Signed)
Call placed to patient. Patient was informed that he can obtain sample pack of needles from [facility] pharmacy at no charge. Pt was appreciative for the assistance. LCSW inquired if he has heard from the Eviction Mediation and/or Legal Aid. Pt shared that he was instructed to text a copy of DSS paperwork to lawyer; however, he is unsure how to do so. LCSW encouraged patient to provide a copy to LCSW, who will then e-mail paperwork to housing attorney. Pt verbalized understanding and will do so at his earliest convenience.

## 2020-06-14 NOTE — Telephone Encounter (Signed)
Call placed to patient. Patient shared that he was able to fill a medication for $4 yesterday; however, there is an additional medication for $10 that he was unable to afford. He was unable to provide name of medication. LCSW will utilize patient assistance funding to pay for remaining medication and contact pt with an update on availability for pick up. No additional concerns noted.

## 2020-06-28 ENCOUNTER — Other Ambulatory Visit: Payer: Self-pay

## 2020-06-28 ENCOUNTER — Ambulatory Visit: Payer: Self-pay | Attending: Family Medicine

## 2020-06-28 DIAGNOSIS — E039 Hypothyroidism, unspecified: Secondary | ICD-10-CM

## 2020-06-28 MED FILL — ?LEVOTHYROXINE 125 MCG TABL: 125 | 30 days supply | Qty: 30 | Fill #1

## 2020-06-29 ENCOUNTER — Other Ambulatory Visit: Payer: Self-pay | Admitting: Family

## 2020-06-29 LAB — TSH+T4F+T3FREE
Free T4: 1.29 ng/dL (ref 0.82–1.77)
T3, Free: 3 pg/mL (ref 2.0–4.4)
TSH: 6.83 u[IU]/mL — ABNORMAL HIGH (ref 0.450–4.500)

## 2020-06-29 MED ORDER — LEVOTHYROXINE SODIUM 137 MCG PO TABS: 125.0000 ug | ORAL_TABLET | Freq: Every day | ORAL | 0 refills | Status: DC

## 2020-06-29 NOTE — Addendum Note (Signed)
Addended by: Rema Fendt on: 06/29/2020 05:12 PM   Modules accepted: Orders

## 2020-06-29 NOTE — Progress Notes (Signed)
Please call patient with update.   Thyroid levels have increased.   Will increase Levothyroxine from 125 mcg/daily to 137 mcg/daily.   Return to lab in 6 weeks for repeat thyroid lab.

## 2020-06-29 NOTE — Addendum Note (Signed)
Addended by: Rema Fendt on: 06/29/2020 05:16 PM   Modules accepted: Orders

## 2020-06-30 MED FILL — ?LEVOTHYROXINE 137 MCG TAB: 137 | 30 days supply | Qty: 30 | Fill #0

## 2020-07-03 NOTE — Progress Notes (Deleted)
Patient is a 55 year old male who had a perineal abscess that was drained by Dr. Ezzard Standing on 03/21/2020 while patient was in the hospital.  He was sent to our office for wound care instructions and follow-up.  At last visit on 06/02/2020 groin wound showed good improvement and was 8 x 1 mm in size.  He has been doing Vaseline twice a day and covering with gauze as able.

## 2020-07-05 ENCOUNTER — Ambulatory Visit: Payer: Self-pay | Admitting: Plastic Surgery

## 2020-07-05 DIAGNOSIS — L02215 Cutaneous abscess of perineum: Secondary | ICD-10-CM

## 2020-07-06 ENCOUNTER — Other Ambulatory Visit: Payer: Self-pay

## 2020-07-06 ENCOUNTER — Encounter: Payer: Self-pay | Admitting: Critical Care Medicine

## 2020-07-06 ENCOUNTER — Other Ambulatory Visit: Payer: Self-pay | Admitting: Critical Care Medicine

## 2020-07-06 ENCOUNTER — Ambulatory Visit: Payer: Self-pay | Attending: Critical Care Medicine | Admitting: Critical Care Medicine

## 2020-07-06 VITALS — BP 95/61 | HR 90 | Temp 98.8°F | Ht 72.0 in | Wt 189.0 lb

## 2020-07-06 DIAGNOSIS — E1169 Type 2 diabetes mellitus with other specified complication: Secondary | ICD-10-CM

## 2020-07-06 DIAGNOSIS — M5126 Other intervertebral disc displacement, lumbar region: Secondary | ICD-10-CM | POA: Insufficient documentation

## 2020-07-06 DIAGNOSIS — E785 Hyperlipidemia, unspecified: Secondary | ICD-10-CM

## 2020-07-06 DIAGNOSIS — M5020 Other cervical disc displacement, unspecified cervical region: Secondary | ICD-10-CM | POA: Insufficient documentation

## 2020-07-06 DIAGNOSIS — M2391 Unspecified internal derangement of right knee: Secondary | ICD-10-CM | POA: Insufficient documentation

## 2020-07-06 DIAGNOSIS — E039 Hypothyroidism, unspecified: Secondary | ICD-10-CM

## 2020-07-06 DIAGNOSIS — L02214 Cutaneous abscess of groin: Secondary | ICD-10-CM

## 2020-07-06 DIAGNOSIS — M5416 Radiculopathy, lumbar region: Secondary | ICD-10-CM | POA: Insufficient documentation

## 2020-07-06 DIAGNOSIS — R072 Precordial pain: Secondary | ICD-10-CM

## 2020-07-06 DIAGNOSIS — Z87891 Personal history of nicotine dependence: Secondary | ICD-10-CM

## 2020-07-06 LAB — GLUCOSE, POCT (MANUAL RESULT ENTRY): POC Glucose: 192 mg/dl — AB (ref 70–99)

## 2020-07-06 MED ORDER — METFORMIN HCL 500 MG PO TABS: 500.0000 mg | ORAL_TABLET | Freq: Two times a day (BID) | ORAL | 2 refills | Status: DC

## 2020-07-06 MED ORDER — ATORVASTATIN CALCIUM 40 MG PO TABS: 40.0000 mg | ORAL_TABLET | Freq: Every day | ORAL | 2 refills | Status: DC

## 2020-07-06 MED ORDER — SENNOSIDES-DOCUSATE SODIUM 8.6-50 MG PO TABS
1.0000 | ORAL_TABLET | Freq: Every day | ORAL | 1 refills | Status: DC
Start: 2020-07-06 — End: 2020-08-22

## 2020-07-06 MED FILL — ?ATORVASTATIN 40MG TABLET: 40 | 30 days supply | Qty: 30 | Fill #1

## 2020-07-06 MED FILL — GABAPENTIN 300 MG CAPSULE: 300 | 30 days supply | Qty: 30 | Fill #1

## 2020-07-06 NOTE — Patient Instructions (Addendum)
Continue your insulin glargine Basaglar daily at bedtime as per our pharmacist recommendation today. You should continue to take 24 units daily.   Do not use your sliding scale insulin. Your blood sugars are doing well without that.  Begin Senokot 1 daily for constipation and increase your fluid intake  Continue to take the Metformin twice daily with food  Follow a healthy diet as outlined below  Go to [facility] to get your second Covid vaccine  Begin to make an appointment with our clinical social worker Leavy Cella regarding your stress that you are under right now  We will hold off on the CAT scan of the heart for now  Return to see Dr. Delford Field 6 weeks  Return the week of January 19 to have repeat labs done on your thyroid function

## 2020-07-06 NOTE — Assessment & Plan Note (Signed)
Groin abscess with perineal cellulitis has now resolved no further antibiotics indicated

## 2020-07-06 NOTE — Assessment & Plan Note (Signed)
Type 2 diabetes reasonable control  Plan  After discussion with clinical pharmacy will discontinue sliding scale NovoLog and continue Lantus 24 units daily and continue Metformin twice daily

## 2020-07-06 NOTE — Assessment & Plan Note (Signed)
Patient is a former smoker has quit since March 2020
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
## 2020-07-06 NOTE — Assessment & Plan Note (Signed)
Mild precordial chest pain unclear etiology  I do not believe a coronary CT scan is indicated at this time  If symptoms persist or worsen will consider referral to cardiology  Patient attempt to get insurance at this time or we may apply for the orange card

## 2020-07-06 NOTE — Assessment & Plan Note (Signed)
Continue Synthroid 137 mcg daily and follow-up thyroid function in 3 weeks

## 2020-07-07 MED FILL — METFORMIN HCL 500 MG TABS: 500 | 30 days supply | Qty: 60 | Fill #0

## 2020-07-20 ENCOUNTER — Ambulatory Visit (HOSPITAL_BASED_OUTPATIENT_CLINIC_OR_DEPARTMENT_OTHER): Payer: Self-pay | Admitting: Licensed Clinical Social Worker

## 2020-07-20 ENCOUNTER — Ambulatory Visit: Payer: Self-pay | Attending: Critical Care Medicine

## 2020-07-20 ENCOUNTER — Other Ambulatory Visit: Payer: Self-pay

## 2020-07-20 DIAGNOSIS — E039 Hypothyroidism, unspecified: Secondary | ICD-10-CM

## 2020-07-20 DIAGNOSIS — F4323 Adjustment disorder with mixed anxiety and depressed mood: Secondary | ICD-10-CM

## 2020-07-21 ENCOUNTER — Telehealth (INDEPENDENT_AMBULATORY_CARE_PROVIDER_SITE_OTHER): Payer: Self-pay

## 2020-07-21 LAB — THYROID PANEL WITH TSH
Free Thyroxine Index: 3.7 (ref 1.2–4.9)
T3 Uptake Ratio: 36 % (ref 24–39)
T4, Total: 10.3 ug/dL (ref 4.5–12.0)
TSH: 2.07 u[IU]/mL (ref 0.450–4.500)

## 2020-07-21 NOTE — Telephone Encounter (Signed)
-----   Message from Storm Frisk, MD sent at 07/21/2020  6:05 AM EST ----- Let mr Kinney know his thyroid function is normal   no change in thyroid med

## 2020-07-21 NOTE — Telephone Encounter (Signed)
Patient verified date of birth. He is aware that thyroid function is normal; no change in thyroid medication. Maryjean Morn, CMA

## 2020-07-25 NOTE — BH Specialist Note (Signed)
Integrated Behavioral Health Follow Up In-Person Visit  MRN: [id number] Name: Kevin Oneill  Number of Integrated Behavioral Health Clinician visits: 2/6 Session Start time: 10:05 AM  Session End time: 10:40 AM Total time: 35  minutes  Types of Service: Individual psychotherapy  Interpretor:No. Interpretor Name and Language: NA  Subjective: [name] is a 55 y.o. male accompanied by self Patient was referred by NP Zonia Kief for stress. Patient reports the following symptoms/concerns: Pt reports continued stress regarding psychosocial stressors. Since last visit, pt has not heard from Legal Aid or [facility] for Housing and [employer]. Pt also reports increase in falls due to medical conditions Duration of problem: December 2021; Severity of problem: moderate  Objective: Mood: Anxious and Affect: Appropriate Risk of harm to self or others: No plan to harm self or others  Life Context: Family and Social: Pt receives strong support from spouse School/Work: Pt is not employed at this time. Spouse is working.  Self-Care: Pt has been navigating resources in the community to assist with emergency rental and utility assistance.  Life Changes: Pt is experiencing difficulty managing stress triggered by financial strain.   Patient and/or Family's Strengths/Protective Factors: Social connections, Social and Emotional competence and Sense of purpose  Goals Addressed: Patient will: 1.  Increase knowledge and/or ability of: stress reduction Pt agreed to continue spending time with loved ones and focusing on positive events to assist in management of stress   Progress towards Goals: Ongoing  Interventions: Interventions utilized:  Solution-Focused Strategies Standardized Assessments completed: Not Needed  Patient Response: Pt was engaged during session  Patient Centered Plan: Patient is on the following Treatment Plan(s): Stress  Assessment: Patient currently  experiencing difficulty managing stress triggered by psychosocial stressors. Pt shared that stress is negatively impacting his ability to properly manage blood sugars. He complains of increase in falls; however, states he is unable to afford a cane.   Patient may benefit from continued therapy and assistance through community resources. LCSW left message for Chucky May, Mediation Coordinator, [phone] to inquire about the Eviction Mediation Program through [facility] for Housing and [employer]. Follow up email sent to [employer] and pt was provided a cane to assist with mobility.  Plan: 1. Follow up with behavioral health clinician on : Contact LCSW if you do not hear back from Legal Aid or Eviction Mediation Coordinator 2. Behavioral recommendations: Utilize strategies discussed 3. Referral(s): Integrated Behavioral Health Services (In Clinic) 4. "From scale of 1-10, how likely are you to follow plan?":   Bridgett Larsson, LCSW 07/25/2020 8:52 AM

## 2020-07-29 MED FILL — LEVOTHYROXINE 137 MCG TAB: 137 | 15 days supply | Qty: 15 | Fill #1

## 2020-07-29 MED FILL — GABAPENTIN 300 MG CAPSULE: 300 | 30 days supply | Qty: 30 | Fill #2

## 2020-08-03 ENCOUNTER — Other Ambulatory Visit: Payer: Self-pay | Admitting: Gastroenterology

## 2020-08-03 ENCOUNTER — Ambulatory Visit (AMBULATORY_SURGERY_CENTER): Payer: Self-pay | Admitting: *Deleted

## 2020-08-03 ENCOUNTER — Other Ambulatory Visit: Payer: Self-pay

## 2020-08-03 VITALS — Ht 72.0 in | Wt 186.0 lb

## 2020-08-03 DIAGNOSIS — Z1211 Encounter for screening for malignant neoplasm of colon: Secondary | ICD-10-CM

## 2020-08-03 MED ORDER — SUTAB 1479-225-188 MG PO TABS: 1.0000 | ORAL_TABLET | Freq: Once | ORAL | 0 refills | Status: DC

## 2020-08-03 NOTE — Progress Notes (Signed)

## 2020-08-04 MED FILL — SUTAB 1479-225-188 MG TABS: 1479-225-18 | 1 days supply | Qty: 24 | Fill #0

## 2020-08-09 ENCOUNTER — Telehealth: Payer: Self-pay | Admitting: Gastroenterology

## 2020-08-09 NOTE — Telephone Encounter (Signed)
Pt states Sutab is $75- he was not given a coupon at [facility] per pt Pt states he is out of work and has no money to purchase any prep or meds at this time  Will do a plenvu sample- pt will pick up prep and new instructions on WED 2-9 on 3rd floor  Plenvu instructions discussed over the phone today - pt will call with questions

## 2020-08-17 ENCOUNTER — Encounter: Payer: Self-pay | Admitting: Gastroenterology

## 2020-08-17 ENCOUNTER — Ambulatory Visit (AMBULATORY_SURGERY_CENTER): Payer: 59 | Admitting: Gastroenterology

## 2020-08-17 ENCOUNTER — Other Ambulatory Visit: Payer: Self-pay

## 2020-08-17 VITALS — BP 100/61 | HR 84 | Temp 97.1°F | Resp 19 | Ht 72.0 in | Wt 186.0 lb

## 2020-08-17 DIAGNOSIS — Z1211 Encounter for screening for malignant neoplasm of colon: Secondary | ICD-10-CM | POA: Diagnosis not present

## 2020-08-17 MED ORDER — SODIUM CHLORIDE 0.9 % IV SOLN: 500.0000 mL | Freq: Once | INTRAVENOUS | Status: DC

## 2020-08-17 NOTE — Patient Instructions (Signed)
Please read handouts provided. Continue present medications.   YOU HAD AN ENDOSCOPIC PROCEDURE TODAY AT THE [facility]:   Refer to the procedure report that was given to you for any specific questions about what was found during the examination.  If the procedure report does not answer your questions, please call your gastroenterologist to clarify.  If you requested that your care partner not be given the details of your procedure findings, then the procedure report has been included in a sealed envelope for you to review at your convenience later.  YOU SHOULD EXPECT: Some feelings of bloating in the abdomen. Passage of more gas than usual.  Walking can help get rid of the air that was put into your GI tract during the procedure and reduce the bloating. If you had a lower endoscopy (such as a colonoscopy or flexible sigmoidoscopy) you may notice spotting of blood in your stool or on the toilet paper. If you underwent a bowel prep for your procedure, you may not have a normal bowel movement for a few days.  Please Note:  You might notice some irritation and congestion in your nose or some drainage.  This is from the oxygen used during your procedure.  There is no need for concern and it should clear up in a day or so.  SYMPTOMS TO REPORT IMMEDIATELY:  Following lower endoscopy (colonoscopy or flexible sigmoidoscopy):  Excessive amounts of blood in the stool  Significant tenderness or worsening of abdominal pains  Swelling of the abdomen that is new, acute  Fever of 100F or higher   For urgent or emergent issues, a gastroenterologist can be reached at any hour by calling [phone]. Do not use MyChart messaging for urgent concerns.    DIET:  We do recommend a small meal at first, but then you may proceed to your regular diet.  Drink plenty of fluids but you should avoid alcoholic beverages for 24 hours.  ACTIVITY:  You should plan to take it easy for the rest of today and  you should NOT DRIVE or use heavy machinery until tomorrow (because of the sedation medicines used during the test).    FOLLOW UP: Our staff will call the number listed on your records 48-72 hours following your procedure to check on you and address any questions or concerns that you may have regarding the information given to you following your procedure. If we do not reach you, we will leave a message.  We will attempt to reach you two times.  During this call, we will ask if you have developed any symptoms of COVID 19. If you develop any symptoms (ie: fever, flu-like symptoms, shortness of breath, cough etc.) before then, please call [phone].  If you test positive for Covid 19 in the 2 weeks post procedure, please call and report this information to us.    If any biopsies were taken you will be contacted by phone or by letter within the next 1-3 weeks.  Please call us at [phone] if you have not heard about the biopsies in 3 weeks.    SIGNATURES/CONFIDENTIALITY: You and/or your care partner have signed paperwork which will be entered into your electronic medical record.  These signatures attest to the fact that that the information above on your After Visit Summary has been reviewed and is understood.  Full responsibility of the confidentiality of this discharge information lies with you and/or your care-partner.  

## 2020-08-17 NOTE — Progress Notes (Signed)
AR - Check-in  SB - VS  Pt's states no medical or surgical changes since previsit or office visit. 

## 2020-08-17 NOTE — Op Note (Signed)
[facility] Patient Name: Kevin ReedyRobert Oneill Procedure Date: 08/17/2020 9:35 AM MRN: [id number] Endoscopist: Tressia DanasKimberly Cordera Stineman MD, MD Age: [age] Referring MD:  Date of Birth: 19-Jul-1965 Gender: Male Account #: [id number] Procedure:                Colonoscopy Indications:              Screening for colorectal malignant neoplasm, This                            is the patient's first colonoscopy, Incidental                            constipation noted                           No known family history of colon cancer or polyps Medicines:                Monitored Anesthesia Care Procedure:                Pre-Anesthesia Assessment:                           - Prior to the procedure, a History and Physical                            was performed, and patient medications and                            allergies were reviewed. The patient's tolerance of                            previous anesthesia was also reviewed. The risks                            and benefits of the procedure and the sedation                            options and risks were discussed with the patient.                            All questions were answered, and informed consent                            was obtained. Prior Anticoagulants: The patient has                            taken no previous anticoagulant or antiplatelet                            agents. ASA Grade Assessment: II - A patient with                            mild systemic disease. After reviewing the risks  and benefits, the patient was deemed in                            satisfactory condition to undergo the procedure.                           After obtaining informed consent, the colonoscope                            was passed under direct vision. Throughout the                            procedure, the patient's blood pressure, pulse, and                            oxygen saturations were monitored  continuously. The                            Olympus CF-[id number] ([id number]) Colonoscope was                            introduced through the anus and advanced to the the                            cecum, identified by appendiceal orifice and                            ileocecal valve. A second forward view of the right                            colon was performed. The colonoscopy was performed                            without difficulty. The patient tolerated the                            procedure well. The quality of the bowel                            preparation was good although over of                            residual liquid was removed during the procedure.                            The ileocecal valve, appendiceal orifice, and                            rectum were photographed. Scope In: 9:40:34 AM Scope Out: 9:55:07 AM Scope Withdrawal Time: 0 hours 10 minutes 50 seconds  Total Procedure Duration: 0 hours 14 minutes 33 seconds  Findings:                 The perianal and digital rectal examinations were  normal.                           Non-bleeding internal hemorrhoids were found.                           Anal papilla(e) were hypertrophied. Estimated blood                            loss was minimal.                           Over of residual liquid was removed during                            the procedure. The exam was otherwise without                            abnormality on direct and retroflexion views. Complications:            No immediate complications. Estimated Blood Loss:     Estimated blood loss: none. Impression:               - Non-bleeding internal hemorrhoids.                           - Anal papilla(e) were hypertrophied.                           - The examination was otherwise normal on direct                            and retroflexion views.                           - No specimens  collected. Recommendation:           - Patient has a contact number available for                            emergencies. The signs and symptoms of potential                            delayed complications were discussed with the                            patient. Return to normal activities tomorrow.                            Written discharge instructions were provided to the                            patient.                           - Resume previous diet.                           -  Continue present medications.                           - Repeat colonoscopy in 10 years for surveillance,                            earlier with new symptoms. Plan two day bowel prep                            at that time.                           - Emerging evidence supports eating a diet of                            fruits, vegetables, grains, calcium, and yogurt                            while reducing red meat and alcohol may reduce the                            risk of colon cancer.                           - Thank you for allowing me to be involved in your                            colon cancer prevention. Tressia Danas MD, MD 08/17/2020 10:01:14 AM This report has been signed electronically.

## 2020-08-17 NOTE — Progress Notes (Signed)
Report to PACU, RN, vss, BBS= Clear.  

## 2020-08-19 ENCOUNTER — Telehealth: Payer: Self-pay

## 2020-08-19 NOTE — Telephone Encounter (Signed)
LVM
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
## 2020-08-22 ENCOUNTER — Other Ambulatory Visit: Payer: Self-pay | Admitting: Family Medicine

## 2020-08-22 ENCOUNTER — Encounter: Payer: Self-pay | Admitting: Critical Care Medicine

## 2020-08-22 ENCOUNTER — Other Ambulatory Visit: Payer: Self-pay | Admitting: Family

## 2020-08-22 ENCOUNTER — Ambulatory Visit: Payer: 59 | Attending: Critical Care Medicine | Admitting: Critical Care Medicine

## 2020-08-22 ENCOUNTER — Other Ambulatory Visit: Payer: Self-pay

## 2020-08-22 ENCOUNTER — Other Ambulatory Visit: Payer: Self-pay | Admitting: Pharmacist

## 2020-08-22 VITALS — BP 100/65 | HR 85 | Ht 72.0 in | Wt 197.0 lb

## 2020-08-22 DIAGNOSIS — E1169 Type 2 diabetes mellitus with other specified complication: Secondary | ICD-10-CM | POA: Diagnosis not present

## 2020-08-22 DIAGNOSIS — E039 Hypothyroidism, unspecified: Secondary | ICD-10-CM

## 2020-08-22 DIAGNOSIS — G8929 Other chronic pain: Secondary | ICD-10-CM

## 2020-08-22 DIAGNOSIS — M25561 Pain in right knee: Secondary | ICD-10-CM | POA: Diagnosis not present

## 2020-08-22 DIAGNOSIS — E785 Hyperlipidemia, unspecified: Secondary | ICD-10-CM | POA: Diagnosis not present

## 2020-08-22 DIAGNOSIS — M2391 Unspecified internal derangement of right knee: Secondary | ICD-10-CM

## 2020-08-22 LAB — GLUCOSE, POCT (MANUAL RESULT ENTRY): POC Glucose: 91 mg/dl (ref 70–99)

## 2020-08-22 MED ORDER — TRUE METRIX BLOOD GLUCOSE TEST VI STRP
ORAL_STRIP | 12 refills | Status: DC
Start: 2020-08-22 — End: 2020-08-22

## 2020-08-22 MED ORDER — INSULIN GLARGINE 100 UNIT/ML SOLOSTAR PEN: 24.0000 [IU] | PEN_INJECTOR | Freq: Every day | SUBCUTANEOUS | 3 refills | Status: DC

## 2020-08-22 MED ORDER — TRUEPLUS LANCETS 28G MISC: 4 refills | Status: DC

## 2020-08-22 MED ORDER — CONTOUR NEXT MONITOR W/DEVICE KIT: PACK | 0 refills | Status: DC

## 2020-08-22 MED ORDER — LEVOTHYROXINE SODIUM 137 MCG PO TABS: 125.0000 ug | ORAL_TABLET | Freq: Every day | ORAL | Status: DC

## 2020-08-22 MED ORDER — POLYETHYLENE GLYCOL 3350 17 GM/SCOOP PO POWD: 17.0000 g | Freq: Two times a day (BID) | ORAL | 1 refills | Status: DC | PRN

## 2020-08-22 MED ORDER — TRUEPLUS PEN NEEDLES 32G X 4 MM MISC: 2 refills | Status: DC

## 2020-08-22 MED ORDER — GABAPENTIN 300 MG PO CAPS: 300.0000 mg | ORAL_CAPSULE | Freq: Every day | ORAL | 2 refills | Status: DC

## 2020-08-22 MED ORDER — CONTOUR NEXT TEST VI STRP: ORAL_STRIP | 2 refills | Status: DC

## 2020-08-22 MED ORDER — ATORVASTATIN CALCIUM 40 MG PO TABS
40.0000 mg | ORAL_TABLET | Freq: Every day | ORAL | 2 refills | Status: DC
Start: 2020-08-22 — End: 2020-08-22

## 2020-08-22 MED ORDER — MICROLET LANCETS MISC: 2 refills | Status: DC

## 2020-08-22 MED ORDER — EMPAGLIFLOZIN 10 MG PO TABS: 10.0000 mg | ORAL_TABLET | Freq: Every day | ORAL | 1 refills | Status: DC

## 2020-08-22 MED FILL — LEVOTHYROXINE 137 MCG TAB: 137 | 30 days supply | Qty: 30 | Fill #0

## 2020-08-22 MED FILL — ATORVASTATIN CALCIUM 40 MG: 40 | 90 days supply | Qty: 90 | Fill #0

## 2020-08-22 MED FILL — CONTOUR NEXT STRIPS: 30 days supply | Qty: 100 | Fill #0

## 2020-08-22 MED FILL — CONTOUR NEXT METER: W/DEVICE | 1 days supply | Qty: 1 | Fill #0

## 2020-08-22 MED FILL — LANTUS SOLOSTAR 100 UNITS/M: 100 | 24 days supply | Qty: 6 | Fill #0

## 2020-08-22 MED FILL — MICROLET LANCETS MISC: 33 days supply | Qty: 100 | Fill #0

## 2020-08-22 MED FILL — TRUEPLUS PEN NDL 32GX5/32: 32G X 4 MM | 30 days supply | Qty: 100 | Fill #0

## 2020-08-22 MED FILL — JARDIANCE 10 MG TABLET: 10 | 30 days supply | Qty: 30 | Fill #0

## 2020-08-22 NOTE — Assessment & Plan Note (Signed)
Thyroid function was normal at the last visit we will continue Synthroid at current dose level

## 2020-08-22 NOTE — Assessment & Plan Note (Signed)
Chronic right knee pain worsening at this time  Referral to orthopedics was made

## 2020-08-22 NOTE — Patient Instructions (Signed)
Stop Metformin Begin Jardiance 1 pill daily for blood sugar and continue Lantus  No other medication changes  MiraLAX 1 scoop in a 12 ounce glass of water 1-2 times daily to help with bowel movements  Drink plenty of fluids and follow constipation protocol below  Referral to orthopedics is made for your right knee  Follow-up with Dr. Delford Field again in 3 months  I will look into a patient assistance program for potential eyeglasses for you and call you   Constipation, Adult Constipation is when a person has trouble pooping (having a bowel movement). When you have this condition, you may poop fewer than 3 times a week. Your poop (stool) may also be dry, hard, or bigger than normal. Follow these instructions at home: Eating and drinking  Eat foods that have a lot of fiber, such as: ? Fresh fruits and vegetables. ? Whole grains. ? Beans.  Eat less of foods that are low in fiber and high in fat and sugar, such as: ? [place] fries. ? Hamburgers. ? Cookies. ? Candy. ? Soda.  Drink enough fluid to keep your pee (urine) pale yellow.   General instructions  Exercise regularly or as told by your doctor. Try to do 150 minutes of exercise each week.  Go to the restroom when you feel like you need to poop. Do not hold it in.  Take over-the-counter and prescription medicines only as told by your doctor. These include any fiber supplements.  When you poop: ? Do deep breathing while relaxing your lower belly (abdomen). ? Relax your pelvic floor. The pelvic floor is a group of muscles that support the rectum, bladder, and intestines (as well as the uterus in women).  Watch your condition for any changes. Tell your doctor if you notice any.  Keep all follow-up visits as told by your doctor. This is important. Contact a doctor if:  You have pain that gets worse.  You have a fever.  You have not pooped for 4 days.  You vomit.  You are not hungry.  You lose weight.  You are  bleeding from the opening of the butt (anus).  You have thin, pencil-like poop. Get help right away if:  You have a fever, and your symptoms suddenly get worse.  You leak poop or have blood in your poop.  Your belly feels hard or bigger than normal (bloated).  You have very bad belly pain.  You feel dizzy or you faint. Summary  Constipation is when a person poops fewer than 3 times a week, has trouble pooping, or has poop that is dry, hard, or bigger than normal.  Eat foods that have a lot of fiber.  Drink enough fluid to keep your pee (urine) pale yellow.  Take over-the-counter and prescription medicines only as told by your doctor. These include any fiber supplements. This information is not intended to replace advice given to you by your health care provider. Make sure you discuss any questions you have with your health care provider. Document Revised: 05/06/2019 Document Reviewed: 05/06/2019 Elsevier Patient Education  2021 [employer].

## 2020-08-22 NOTE — Assessment & Plan Note (Signed)
Continue lipid therapy refills given

## 2020-08-22 NOTE — Progress Notes (Signed)
Having pain in right leg. Needs refills on medications.

## 2020-08-22 NOTE — Assessment & Plan Note (Signed)
A1c was at goal however patient having side effects with Metformin causing severe constipation despite increased fluid intake  Plan to discontinue Metformin and give trial of Jardiance 10 mg daily we will continue the Lantus at current dose level  Patient given a bowel protocol

## 2020-08-31 ENCOUNTER — Ambulatory Visit (INDEPENDENT_AMBULATORY_CARE_PROVIDER_SITE_OTHER): Payer: 59

## 2020-08-31 ENCOUNTER — Other Ambulatory Visit: Payer: Self-pay

## 2020-08-31 ENCOUNTER — Ambulatory Visit (INDEPENDENT_AMBULATORY_CARE_PROVIDER_SITE_OTHER): Payer: 59 | Admitting: Orthopaedic Surgery

## 2020-08-31 ENCOUNTER — Encounter: Payer: Self-pay | Admitting: Orthopaedic Surgery

## 2020-08-31 VITALS — Ht 72.0 in | Wt 197.0 lb

## 2020-08-31 DIAGNOSIS — M7061 Trochanteric bursitis, right hip: Secondary | ICD-10-CM | POA: Diagnosis not present

## 2020-08-31 DIAGNOSIS — G8929 Other chronic pain: Secondary | ICD-10-CM | POA: Diagnosis not present

## 2020-08-31 DIAGNOSIS — R1031 Right lower quadrant pain: Secondary | ICD-10-CM

## 2020-08-31 DIAGNOSIS — M25561 Pain in right knee: Secondary | ICD-10-CM | POA: Diagnosis not present

## 2020-08-31 MED ORDER — DIAZEPAM 5 MG PO TABS: ORAL_TABLET | ORAL | 0 refills | Status: DC
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
## 2020-09-01 ENCOUNTER — Telehealth: Payer: Self-pay

## 2020-09-01 NOTE — Telephone Encounter (Signed)
Call placed to patient regarding need for eye exam and glasses. Per his insurance card, he has a free eye exam every year.  He said that he called [facility] and they do not accept his insurance. He had not called the insurance company about in network providers. He also said that his plan does not cover glasses.  Call placed to [employer] # [phone], spoke to Delice Bison who said that his vision coverage is through [employer] # [phone].  This CM called [employer] and was referred to their website to locate providers.    Call placed to the patient to inquire if he has access to computer to obtain information about [employer].  He said he did not have computer access and requested that some of the eye doctors numbers be text to him.  He said he was not aware of [employer]. Informed him that this CM would send him some numbers to call as well as the phone number for [employer].  He needs to call and inquire about exam coverage and also ask about benefit for glasses if any.  Instructed him to then call this CM back with the information that he receives. [facility] will try to assist with funding for the glasses/exam if needed.  The number for [employer] as well as [facility], [facility]. And [facility] were text to the patient as he requested.

## 2020-09-08 ENCOUNTER — Telehealth: Payer: Self-pay

## 2020-09-08 ENCOUNTER — Ambulatory Visit: Payer: Self-pay | Admitting: Internal Medicine

## 2020-09-08 NOTE — Telephone Encounter (Signed)
As a follow up from call last week, Call placed to patient to inquire if he has contacted his vision insurance, [employer], about providers in this area.  As per [employer], his vision coverage is through [employer] and he gets 1 free exam/year. This CM had provided him with contact information for 3 local eye doctors in this area as well as the contact # for [employer]. He said the he has not found anyone that takes his insurance. He went to [facility] and somewhere else,  He did not call [employer]. This CM explained again that his vision coverage is not [employer]; but [employer]. He requested the number for [employer] be text to him again.  This CM called [facility] # [phone], spoke to [place] who said that they accept most [employer] plans but not his.   Call placed to [facility] # [phone], spoke to [place] who said that they are in network with [employer] and she could find him in their network  and they can see him.    Call placed to [facility] # [phone], spoke to [place] who was also able to confirm that the patient is in their network. She also confirmed that he has a $0 copay for an exam annually and he has 20% off glasses and lenses.  She noted that they are scheduling appointments for May.   Call placed to patient and explained above calls with local providers.  Encouraged him to call to obtain more information from those practices as well as contact [employer] for additional providers. It is important to schedule an exam.  This CM text him the phone numbers he requested and explained that we can determine financial assistance for glasses after he has his exam.

## 2020-09-22 ENCOUNTER — Telehealth: Payer: Self-pay

## 2020-09-22 ENCOUNTER — Encounter (INDEPENDENT_AMBULATORY_CARE_PROVIDER_SITE_OTHER): Payer: Self-pay

## 2020-09-22 MED FILL — GABAPENTIN 300 MG CAPSULE: 300 | 30 days supply | Qty: 30 | Fill #0

## 2020-09-22 MED FILL — LANTUS SOLOSTAR 100 UNITS/M: 100 | 24 days supply | Qty: 6 | Fill #1

## 2020-09-22 MED FILL — TRUEplus 5-BEVEL PEN NEEDLE: 32G X 4 MM | 30 days supply | Qty: 100 | Fill #1

## 2020-09-22 MED FILL — LEVOTHYROXINE 137 MCG TAB: 137 | 30 days supply | Qty: 30 | Fill #1

## 2020-09-22 MED FILL — CONTOUR NEXT STRIPS: 30 days supply | Qty: 100 | Fill #1

## 2020-09-22 MED FILL — MICROLET LANCETS MISC: 33 days supply | Qty: 100 | Fill #1

## 2020-09-22 MED FILL — JARDIANCE 10 MG TABLET: 10 | 30 days supply | Qty: 30 | Fill #1
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
## 2020-10-01 ENCOUNTER — Other Ambulatory Visit: Payer: Self-pay

## 2020-11-07 ENCOUNTER — Other Ambulatory Visit: Payer: Self-pay

## 2020-11-07 MED FILL — Empagliflozin Tab 10 MG: ORAL | 30 days supply | Qty: 30 | Fill #0 | Status: AC

## 2020-11-07 MED FILL — Gabapentin Cap 300 MG: ORAL | 30 days supply | Qty: 30 | Fill #0 | Status: AC

## 2020-11-07 MED FILL — Levothyroxine Sodium Tab 137 MCG: ORAL | 30 days supply | Qty: 30 | Fill #0 | Status: AC

## 2020-11-07 MED FILL — Atorvastatin Calcium Tab 40 MG (Base Equivalent): ORAL | 30 days supply | Qty: 30 | Fill #0 | Status: AC

## 2020-11-09 ENCOUNTER — Other Ambulatory Visit: Payer: Self-pay

## 2020-11-17 ENCOUNTER — Encounter (INDEPENDENT_AMBULATORY_CARE_PROVIDER_SITE_OTHER): Payer: Self-pay

## 2020-11-17 ENCOUNTER — Other Ambulatory Visit: Payer: Self-pay

## 2020-11-17 ENCOUNTER — Ambulatory Visit: Payer: 59 | Attending: Critical Care Medicine | Admitting: Critical Care Medicine

## 2020-11-17 ENCOUNTER — Encounter: Payer: Self-pay | Admitting: Critical Care Medicine

## 2020-11-17 VITALS — BP 124/85 | HR 87 | Temp 98.8°F | Ht 72.0 in | Wt 205.6 lb

## 2020-11-17 DIAGNOSIS — M2391 Unspecified internal derangement of right knee: Secondary | ICD-10-CM | POA: Diagnosis not present

## 2020-11-17 DIAGNOSIS — E1169 Type 2 diabetes mellitus with other specified complication: Secondary | ICD-10-CM

## 2020-11-17 DIAGNOSIS — E785 Hyperlipidemia, unspecified: Secondary | ICD-10-CM | POA: Diagnosis not present

## 2020-11-17 DIAGNOSIS — E039 Hypothyroidism, unspecified: Secondary | ICD-10-CM | POA: Diagnosis not present

## 2020-11-17 LAB — POCT GLYCOSYLATED HEMOGLOBIN (HGB A1C): Hemoglobin A1C: 6.6 % — AB (ref 4.0–5.6)

## 2020-11-17 LAB — GLUCOSE, POCT (MANUAL RESULT ENTRY): POC Glucose: 162 mg/dl — AB (ref 70–99)

## 2020-11-17 MED ORDER — GABAPENTIN 300 MG PO CAPS
ORAL_CAPSULE | Freq: Every day | ORAL | 2 refills | Status: DC
  Filled 2020-11-17: qty 40, fill #0
  Filled 2020-12-19: qty 30, 30d supply, fill #0
  Filled 2021-01-24: qty 30, 30d supply, fill #1
  Filled 2021-03-08: qty 30, 30d supply, fill #2
  Filled 2021-04-17: qty 30, 30d supply, fill #3

## 2020-11-17 MED ORDER — MELOXICAM 15 MG PO TABS
15.0000 mg | ORAL_TABLET | Freq: Every day | ORAL | 0 refills | Status: DC
Start: 2020-11-17 — End: 2021-04-17
  Filled 2020-11-17: qty 30, 30d supply, fill #0

## 2020-11-17 MED ORDER — EMPAGLIFLOZIN 10 MG PO TABS
ORAL_TABLET | Freq: Every day | ORAL | 1 refills | Status: DC
  Filled 2020-11-17: qty 60, fill #0
  Filled 2020-12-19 (×2): qty 30, 30d supply, fill #0
  Filled 2021-01-24: qty 30, 30d supply, fill #1
  Filled 2021-03-08 – 2021-04-17 (×2): qty 30, 30d supply, fill #2

## 2020-11-17 MED ORDER — ATORVASTATIN CALCIUM 40 MG PO TABS
ORAL_TABLET | Freq: Every day | ORAL | 2 refills | Status: DC
Start: 2020-11-17 — End: 2021-04-17
  Filled 2020-11-17: qty 90, fill #0
  Filled 2020-12-19: qty 90, 90d supply, fill #0
  Filled 2021-03-08: qty 90, 90d supply, fill #1

## 2020-11-17 NOTE — Patient Instructions (Signed)
MRI of the right knee will be obtained  Meloxicam 15 mg daily sent to the pharmacy to take for right knee pain and back pain  Refills on your medication sent to our pharmacy no change in dosing  An appointment with [facility] our licensed clinical social worker will be made for anxiety counseling  Return to see Dr. Delford Field 4 months
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
## 2020-11-17 NOTE — Assessment & Plan Note (Signed)
Diabetes well controlled he is at goal  Continue Jardiance and Lantus as prescribed  Refills given

## 2020-11-17 NOTE — Assessment & Plan Note (Signed)
Abnormality the right knee suspect ligament damage  Plan for this patient is to obtain MRI of the right knee and refer back to orthopedic surgery for further follow-up pending on results

## 2020-11-17 NOTE — Assessment & Plan Note (Signed)
Continue lipid therapy as prescribed

## 2020-11-17 NOTE — Assessment & Plan Note (Signed)
Continue hypothyroidism treatment as prescribed

## 2020-11-26 ENCOUNTER — Other Ambulatory Visit: Payer: Self-pay

## 2020-11-26 ENCOUNTER — Ambulatory Visit (HOSPITAL_COMMUNITY): Payer: 59

## 2020-11-26 ENCOUNTER — Emergency Department (HOSPITAL_COMMUNITY)
Admission: EM | Admit: 2020-11-26 | Discharge: 2020-11-26 | Disposition: A | Discharge status: Home / Self Care | Payer: 59 | Attending: Emergency Medicine | Admitting: Emergency Medicine

## 2020-11-26 ENCOUNTER — Emergency Department (HOSPITAL_COMMUNITY): Payer: 59

## 2020-11-26 DIAGNOSIS — Z87891 Personal history of nicotine dependence: Secondary | ICD-10-CM | POA: Insufficient documentation

## 2020-11-26 DIAGNOSIS — E039 Hypothyroidism, unspecified: Secondary | ICD-10-CM | POA: Insufficient documentation

## 2020-11-26 DIAGNOSIS — R10A2 Flank pain, left side: Secondary | ICD-10-CM

## 2020-11-26 DIAGNOSIS — Z7982 Long term (current) use of aspirin: Secondary | ICD-10-CM | POA: Insufficient documentation

## 2020-11-26 DIAGNOSIS — R112 Nausea with vomiting, unspecified: Secondary | ICD-10-CM

## 2020-11-26 DIAGNOSIS — E119 Type 2 diabetes mellitus without complications: Secondary | ICD-10-CM | POA: Diagnosis not present

## 2020-11-26 DIAGNOSIS — R109 Unspecified abdominal pain: Secondary | ICD-10-CM

## 2020-11-26 DIAGNOSIS — Z79899 Other long term (current) drug therapy: Secondary | ICD-10-CM | POA: Insufficient documentation

## 2020-11-26 DIAGNOSIS — R1084 Generalized abdominal pain: Secondary | ICD-10-CM | POA: Diagnosis present

## 2020-11-26 DIAGNOSIS — Z794 Long term (current) use of insulin: Secondary | ICD-10-CM | POA: Diagnosis not present

## 2020-11-26 LAB — COMPREHENSIVE METABOLIC PANEL
ALT: 29 U/L (ref 0–44)
AST: 25 U/L (ref 15–41)
Albumin: 4.3 g/dL (ref 3.5–5.0)
Alkaline Phosphatase: 40 U/L (ref 38–126)
Anion gap: 7 (ref 5–15)
BUN: 21 mg/dL — ABNORMAL HIGH (ref 6–20)
CO2: 30 mmol/L (ref 22–32)
Calcium: 9.4 mg/dL (ref 8.9–10.3)
Chloride: 105 mmol/L (ref 98–111)
Creatinine, Ser: 1.29 mg/dL — ABNORMAL HIGH (ref 0.61–1.24)
GFR, Estimated: >60 mL/min (ref >60)
Glucose, Bld: 87 mg/dL (ref 70–99)
Potassium: 4.1 mmol/L (ref 3.5–5.1)
Sodium: 142 mmol/L (ref 135–145)
Total Bilirubin: 0.2 mg/dL — ABNORMAL LOW (ref 0.3–1.2)
Total Protein: 7.9 g/dL (ref 6.5–8.1)

## 2020-11-26 LAB — CBC WITH DIFFERENTIAL/PLATELET
Abs Immature Granulocytes: 0.03 10*3/uL (ref 0.00–0.07)
Basophils Absolute: 0 10*3/uL (ref 0.0–0.1)
Basophils Relative: 1 %
Eosinophils Absolute: 0.1 10*3/uL (ref 0.0–0.5)
Eosinophils Relative: 1 %
HCT: 48.9 % (ref 39.0–52.0)
Hemoglobin: 16.1 g/dL (ref 13.0–17.0)
Immature Granulocytes: 0 %
Lymphocytes Relative: 20 %
Lymphs Abs: 1.7 10*3/uL (ref 0.7–4.0)
MCH: 28.9 pg (ref 26.0–34.0)
MCHC: 32.9 g/dL (ref 30.0–36.0)
MCV: 87.8 fL (ref 80.0–100.0)
Monocytes Absolute: 0.5 10*3/uL (ref 0.1–1.0)
Monocytes Relative: 5 %
Neutro Abs: 6.2 10*3/uL (ref 1.7–7.7)
Neutrophils Relative %: 73 %
Platelets: 270 10*3/uL (ref 150–400)
RBC: 5.57 MIL/uL (ref 4.22–5.81)
RDW: 11.7 % (ref 11.5–15.5)
WBC: 8.4 10*3/uL (ref 4.0–10.5)
nRBC: 0 % (ref 0.0–0.2)

## 2020-11-26 LAB — URINALYSIS, ROUTINE W REFLEX MICROSCOPIC
Bacteria, UA: NONE SEEN
Bilirubin Urine: NEGATIVE
Glucose, UA: >=500 mg/dL — AB
Hgb urine dipstick: NEGATIVE
Ketones, ur: 5 mg/dL — AB
Leukocytes,Ua: NEGATIVE
Nitrite: NEGATIVE
Protein, ur: NEGATIVE mg/dL
Specific Gravity, Urine: 1.021 (ref 1.005–1.030)
pH: 7 (ref 5.0–8.0)

## 2020-11-26 LAB — LIPASE, BLOOD: Lipase: 32 U/L (ref 11–51)

## 2020-11-26 MED ORDER — ACETAMINOPHEN 500 MG PO TABS
1000.0000 mg | ORAL_TABLET | Freq: Once | ORAL | Status: AC
  Administered 2020-11-26: 1000 mg via ORAL
  Filled 2020-11-26: qty 2

## 2020-11-26 MED ORDER — ONDANSETRON 4 MG PO TBDP: 4.0000 mg | ORAL_TABLET | Freq: Three times a day (TID) | ORAL | 0 refills | Status: DC | PRN

## 2020-11-26 MED ORDER — CYCLOBENZAPRINE HCL 10 MG PO TABS: 10.0000 mg | ORAL_TABLET | Freq: Every day | ORAL | 0 refills | Status: DC

## 2020-11-26 NOTE — ED Provider Notes (Signed)
Emergency Medicine Provider Triage Evaluation Note  Kevin Oneill , a 55 y.o. male  was evaluated in triage.  Pt complains of luq and left flank pain that started a few days ago. Associated nvd. Denies fevers, dysuria, urgency, hematuria. Denies chest pain or sob.  Review of Systems  Positive: abd pain, nvd Negative: Fevers, dysuria, urgency, hematuria, chest pain, sob  Physical Exam  BP 125/83 (BP Location: Left Arm)   Pulse 85   Temp 98.6 F (37 C) (Oral)   Resp 18   Ht 5\' 9"  (1.753 m)   Wt 93 kg   SpO2 100%   BMI 30.27 kg/m  Gen:   Awake, no distress   Resp:  Normal effort  MSK:   Moves extremities without difficulty  Other:  Luq, left flank, rlq and llq ttp  Medical Decision Making  Medically screening exam initiated at 12:24 PM.  Appropriate orders placed.  ESTHER BROYLES was informed that the remainder of the evaluation will be completed by another provider, this initial triage assessment does not replace that evaluation, and the importance of remaining in the ED until their evaluation is complete.    Lyna Poser 11/26/20 1225    11/28/20, MD 11/26/20 [phone]

## 2020-11-26 NOTE — ED Triage Notes (Signed)
Patient reports left flank pain 10/10 x 2 days, pain radiating to stomach today. Patient reports one episode of vomiting this morning. Says he was having constipation but it resolved.

## 2020-11-26 NOTE — Discharge Instructions (Addendum)
I recommend a combination of tylenol and ibuprofen for management of your pain. You can take a low dose of both at the same time. I recommend 500 mg of Tylenol combined with 600 mg of ibuprofen. This is one maximum strength Tylenol and three regular ibuprofen. You can take these 2-3 times for day for your pain. Please try to take these medications with a small amount of food as well to prevent upsetting your stomach.  Also, please consider topical pain relieving creams such as Voltaran Gel, BioFreeze, or Icy Hot. There is also a pain relieving cream made by [employer]. You should be able to find all of these at your local pharmacy.   I prescribed you a medication called Zofran you can take for breakthrough nausea and vomiting you cannot control.  You can take this up to 3 times per day.    I am prescribing you a strong muscle relaxer called flexeril. Please only take this medication once in the evening with dinner. This medication can make you quite drowsy. Do not mix it with alcohol. Do not drive a vehicle after taking it.   If you continue to have worsening nausea/vomiting or pain that you cannot control, please come back to the emergency department so that you can be reevaluated.
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
## 2020-12-12 ENCOUNTER — Encounter (HOSPITAL_BASED_OUTPATIENT_CLINIC_OR_DEPARTMENT_OTHER): Payer: Self-pay | Admitting: *Deleted

## 2020-12-12 ENCOUNTER — Other Ambulatory Visit: Payer: Self-pay

## 2020-12-12 DIAGNOSIS — Z79899 Other long term (current) drug therapy: Secondary | ICD-10-CM | POA: Diagnosis not present

## 2020-12-12 DIAGNOSIS — E785 Hyperlipidemia, unspecified: Secondary | ICD-10-CM | POA: Diagnosis not present

## 2020-12-12 DIAGNOSIS — E039 Hypothyroidism, unspecified: Secondary | ICD-10-CM | POA: Insufficient documentation

## 2020-12-12 DIAGNOSIS — Z7984 Long term (current) use of oral hypoglycemic drugs: Secondary | ICD-10-CM | POA: Insufficient documentation

## 2020-12-12 DIAGNOSIS — E1169 Type 2 diabetes mellitus with other specified complication: Secondary | ICD-10-CM | POA: Insufficient documentation

## 2020-12-12 DIAGNOSIS — Z87891 Personal history of nicotine dependence: Secondary | ICD-10-CM | POA: Diagnosis not present

## 2020-12-12 DIAGNOSIS — Z794 Long term (current) use of insulin: Secondary | ICD-10-CM | POA: Diagnosis not present

## 2020-12-12 DIAGNOSIS — Z7982 Long term (current) use of aspirin: Secondary | ICD-10-CM | POA: Insufficient documentation

## 2020-12-12 DIAGNOSIS — K859 Acute pancreatitis without necrosis or infection, unspecified: Secondary | ICD-10-CM | POA: Insufficient documentation

## 2020-12-12 DIAGNOSIS — R109 Unspecified abdominal pain: Secondary | ICD-10-CM | POA: Diagnosis present

## 2020-12-12 LAB — CBC
HCT: 47.5 % (ref 39.0–52.0)
Hemoglobin: 16 g/dL (ref 13.0–17.0)
MCH: 28.8 pg (ref 26.0–34.0)
MCHC: 33.7 g/dL (ref 30.0–36.0)
MCV: 85.4 fL (ref 80.0–100.0)
Platelets: 241 10*3/uL (ref 150–400)
RBC: 5.56 MIL/uL (ref 4.22–5.81)
RDW: 11.4 % — ABNORMAL LOW (ref 11.5–15.5)
WBC: 6.2 10*3/uL (ref 4.0–10.5)
nRBC: 0 % (ref 0.0–0.2)

## 2020-12-12 LAB — COMPREHENSIVE METABOLIC PANEL
ALT: 20 U/L (ref 0–44)
AST: 16 U/L (ref 15–41)
Albumin: 4.2 g/dL (ref 3.5–5.0)
Alkaline Phosphatase: 47 U/L (ref 38–126)
Anion gap: 9 (ref 5–15)
BUN: 10 mg/dL (ref 6–20)
CO2: 25 mmol/L (ref 22–32)
Calcium: 9.5 mg/dL (ref 8.9–10.3)
Chloride: 105 mmol/L (ref 98–111)
Creatinine, Ser: 1.01 mg/dL (ref 0.61–1.24)
GFR, Estimated: >60 mL/min (ref >60)
Glucose, Bld: 159 mg/dL — ABNORMAL HIGH (ref 70–99)
Potassium: 3.6 mmol/L (ref 3.5–5.1)
Sodium: 139 mmol/L (ref 135–145)
Total Bilirubin: 0.4 mg/dL (ref 0.3–1.2)
Total Protein: 7.6 g/dL (ref 6.5–8.1)

## 2020-12-12 LAB — LIPASE, BLOOD: Lipase: 200 U/L — ABNORMAL HIGH (ref 11–51)

## 2020-12-12 NOTE — ED Triage Notes (Signed)
Pt states that he has lower abdominal pain, started this morning, worse over the past hour. Last BM this morning 'small" . Nausea. No vomiting.

## 2020-12-12 NOTE — ED Notes (Signed)
Pt arrives via [facility] from home with c/o abdominal pain in the lower quadrants. Constipation for several weeks, taking laxatives for the same. Nausea, no vomiting. 168/88, hr 88, rr 18, cbg 174, temp 97.5

## 2020-12-13 ENCOUNTER — Emergency Department (HOSPITAL_BASED_OUTPATIENT_CLINIC_OR_DEPARTMENT_OTHER): Payer: 59

## 2020-12-13 ENCOUNTER — Emergency Department (HOSPITAL_BASED_OUTPATIENT_CLINIC_OR_DEPARTMENT_OTHER)
Admission: EM | Admit: 2020-12-13 | Discharge: 2020-12-13 | Disposition: A | Discharge status: Home / Self Care | Payer: 59 | Attending: Emergency Medicine | Admitting: Emergency Medicine

## 2020-12-13 ENCOUNTER — Encounter (HOSPITAL_BASED_OUTPATIENT_CLINIC_OR_DEPARTMENT_OTHER): Payer: Self-pay | Admitting: Radiology

## 2020-12-13 DIAGNOSIS — R1084 Generalized abdominal pain: Secondary | ICD-10-CM

## 2020-12-13 DIAGNOSIS — K859 Acute pancreatitis without necrosis or infection, unspecified: Secondary | ICD-10-CM

## 2020-12-13 MED ORDER — OXYCODONE-ACETAMINOPHEN 5-325 MG PO TABS: 1.0000 | ORAL_TABLET | Freq: Four times a day (QID) | ORAL | 0 refills | Status: DC | PRN

## 2020-12-13 MED ORDER — IOHEXOL 300 MG/ML  SOLN
100.0000 mL | Freq: Once | INTRAMUSCULAR | Status: AC | PRN
  Administered 2020-12-13: 100 mL via INTRAVENOUS

## 2020-12-13 MED ORDER — MORPHINE SULFATE (PF) 4 MG/ML IV SOLN
4.0000 mg | Freq: Once | INTRAVENOUS | Status: AC
  Administered 2020-12-13: 4 mg via INTRAVENOUS
  Filled 2020-12-13: qty 1

## 2020-12-13 MED ORDER — SODIUM CHLORIDE 0.9 % IV BOLUS
1000.0000 mL | Freq: Once | INTRAVENOUS | Status: AC
  Administered 2020-12-13: 1000 mL via INTRAVENOUS

## 2020-12-13 MED ORDER — ONDANSETRON 4 MG PO TBDP: 4.0000 mg | ORAL_TABLET | Freq: Three times a day (TID) | ORAL | 0 refills | Status: DC | PRN

## 2020-12-13 MED ORDER — ONDANSETRON HCL 4 MG/2ML IJ SOLN
4.0000 mg | Freq: Once | INTRAMUSCULAR | Status: AC
  Administered 2020-12-13: 4 mg via INTRAVENOUS
  Filled 2020-12-13: qty 2

## 2020-12-13 NOTE — Discharge Instructions (Addendum)
You were seen today for abdominal pain.  Your pancreas testing is slightly elevated.  This could be early pancreatitis although your CT scan does not show any significant pancreatic inflammation.  Take pain and nausea medication for the next several days.  Stick to a clear liquid diet for the next 24 to 48 hours until symptoms gradually improved.  You may advance diet as tolerated after that.  If you develop worsening pain, nausea vomiting, or dehydration, you should be reevaluated.
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
## 2020-12-19 ENCOUNTER — Ambulatory Visit (INDEPENDENT_AMBULATORY_CARE_PROVIDER_SITE_OTHER): Payer: 59 | Admitting: Nurse Practitioner

## 2020-12-19 ENCOUNTER — Telehealth: Payer: Self-pay | Admitting: Pharmacist

## 2020-12-19 ENCOUNTER — Telehealth: Payer: Self-pay

## 2020-12-19 ENCOUNTER — Other Ambulatory Visit: Payer: Self-pay

## 2020-12-19 VITALS — BP 122/79 | HR 98 | Temp 97.6°F

## 2020-12-19 DIAGNOSIS — K5903 Drug induced constipation: Secondary | ICD-10-CM | POA: Diagnosis not present

## 2020-12-19 DIAGNOSIS — R1084 Generalized abdominal pain: Secondary | ICD-10-CM

## 2020-12-19 MED ORDER — SENNOSIDES-DOCUSATE SODIUM 8.6-50 MG PO TABS
1.0000 | ORAL_TABLET | Freq: Every day | ORAL | 0 refills | Status: AC
  Filled 2020-12-19: qty 10, 10d supply, fill #0

## 2020-12-19 MED FILL — Levothyroxine Sodium Tab 137 MCG: ORAL | 30 days supply | Qty: 30 | Fill #1 | Status: AC

## 2020-12-19 NOTE — Patient Instructions (Addendum)
Abdominal pain Constipation Diabetes:  Concerned that diabetic medications might be causing constipation and abdominal cramping - will schedule appointment with [facility] / pharmacy  Will order laxative / stool softener  Stay well hydrated  May try warm prune juice  May start probiotic daily  Will place referral to GI    Follow up with PCP   Constipation, Adult Constipation is when a person has trouble pooping (having a bowel movement). When you have this condition, you may poop fewer than 3 times a week. Your poop (stool) may also be dry, hard, or bigger than normal. Follow these instructions at home: Eating and drinking  Eat foods that have a lot of fiber, such as: Fresh fruits and vegetables. Whole grains. Beans. Eat less of foods that are low in fiber and high in fat and sugar, such as: [place] fries. Hamburgers. Cookies. Candy. Soda. Drink enough fluid to keep your pee (urine) pale yellow.  General instructions Exercise regularly or as told by your doctor. Try to do 150 minutes of exercise each week. Go to the restroom when you feel like you need to poop. Do not hold it in. Take over-the-counter and prescription medicines only as told by your doctor. These include any fiber supplements. When you poop: Do deep breathing while relaxing your lower belly (abdomen). Relax your pelvic floor. The pelvic floor is a group of muscles that support the rectum, bladder, and intestines (as well as the uterus in women). Watch your condition for any changes. Tell your doctor if you notice any. Keep all follow-up visits as told by your doctor. This is important. Contact a doctor if: You have pain that gets worse. You have a fever. You have not pooped for 4 days. You vomit. You are not hungry. You lose weight. You are bleeding from the opening of the butt (anus). You have thin, pencil-like poop. Get help right away if: You have a fever, and your symptoms suddenly get worse. You  leak poop or have blood in your poop. Your belly feels hard or bigger than normal (bloated). You have very bad belly pain. You feel dizzy or you faint. Summary Constipation is when a person poops fewer than 3 times a week, has trouble pooping, or has poop that is dry, hard, or bigger than normal. Eat foods that have a lot of fiber. Drink enough fluid to keep your pee (urine) pale yellow. Take over-the-counter and prescription medicines only as told by your doctor. These include any fiber supplements. This information is not intended to replace advice given to you by your health care provider. Make sure you discuss any questions you have with your healthcare provider. Document Revised: 05/06/2019 Document Reviewed: 05/06/2019 Elsevier Patient Education  2022 [employer].

## 2020-12-19 NOTE — Telephone Encounter (Signed)
I am ok with you seeing this patient and ty

## 2020-12-19 NOTE — Telephone Encounter (Signed)
Message received from Angus Seller, NP requesting an appointment for patient with Kevin Oneill, RPH/[facility].   Call placed to patient # [phone], message left with call back requested to this CM. Need to schedule appt for discussion of DM meds.
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
## 2020-12-19 NOTE — Assessment & Plan Note (Signed)
Constipation Diabetes:  Concerned that diabetic medications might be causing constipation and abdominal cramping - will schedule appointment with [facility] / pharmacy  Will order laxative / stool softener  Stay well hydrated  May try warm prune juice  May start probiotic daily  Will place referral to GI    Follow up with PCP

## 2020-12-19 NOTE — Telephone Encounter (Signed)
Kevin Oneill,   This is a patient of Dr. Lynelle Doctor who is concerned with his medications. He is requesting a medication review as he is concerned his DM medications are causing constipation. I am routing this message to Dr. Delford Field as well to make sure he is okay with me seeing the patient.

## 2020-12-20 ENCOUNTER — Other Ambulatory Visit: Payer: Self-pay

## 2020-12-20 NOTE — Telephone Encounter (Signed)
Call placed to patient again and scheduled him to meet with Butch Penny, [facility] 12/26/2020 to discuss DM meds  He said he would like to make an appointment with the clinic LCSW if possible. He said that he has a lot going on.  Explained to him that this CM is not sure when her schedule will be open but he can check when he is in the clinic on 12/26/2020.   The patient also confirmed that he had his eye exam and is waiting for his glasses to be delivered

## 2020-12-22 NOTE — Telephone Encounter (Signed)
Attempted to call pt to schedule appt, no answer, left voicemail.

## 2020-12-26 ENCOUNTER — Ambulatory Visit: Payer: 59 | Admitting: Pharmacist

## 2020-12-28 NOTE — Telephone Encounter (Signed)
Scheduled appt with Mr. Kevin Oneill for 01/04/21 at 1:30PM. He mentioned that he has been to the hosptial several times and is behind on his light bill and rent and may need some assistance. Maxine Glenn can you give him a call and give him some resources.

## 2021-01-04 ENCOUNTER — Other Ambulatory Visit: Payer: Self-pay

## 2021-01-04 ENCOUNTER — Telehealth: Payer: Self-pay

## 2021-01-04 ENCOUNTER — Encounter (INDEPENDENT_AMBULATORY_CARE_PROVIDER_SITE_OTHER): Payer: Self-pay

## 2021-01-04 ENCOUNTER — Ambulatory Visit: Payer: 59 | Attending: Critical Care Medicine | Admitting: Clinical

## 2021-01-04 DIAGNOSIS — F4323 Adjustment disorder with mixed anxiety and depressed mood: Secondary | ICD-10-CM | POA: Diagnosis not present

## 2021-01-04 DIAGNOSIS — Z599 Problem related to housing and economic circumstances, unspecified: Secondary | ICD-10-CM | POA: Diagnosis not present

## 2021-01-04 DIAGNOSIS — F439 Reaction to severe stress, unspecified: Secondary | ICD-10-CM

## 2021-01-04 DIAGNOSIS — F410 Panic disorder [episodic paroxysmal anxiety] without agoraphobia: Secondary | ICD-10-CM | POA: Diagnosis not present

## 2021-01-04 NOTE — Telephone Encounter (Signed)
Following up on a request from [facility] LCSW to access community resources for the patient. Patient is requesting rental and utility assistance. Patient is on the account for the water and power but not for the lease. Patient has been referred to legal aid several times for rental assistance but was unsure of the reason Legal Aid was unable to assist. CM will reach out to Legal Aid. CM shared with patient there is no guarantee of rental assistance in the area, many of the resources have been exhausted until further notice.  Patient shared he does not have a income at the time (has applied for disability 3x's and denied) but his fiance does, who is also caring for two grandchildren. Patient shares their is not enough income with him not being able to work.   In reviewing past patient encounters, the patient has requested rental assistance in the last six months as well.
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
## 2021-01-06 ENCOUNTER — Telehealth: Payer: Self-pay | Admitting: Clinical

## 2021-01-06 NOTE — Telephone Encounter (Signed)
Also, do we know if he can be seen at [facility] with his insurance? I want him to get started on some anti-anxiety/antidepressant medications. If not, perhaps Dr. Delford Field can assist.

## 2021-01-06 NOTE — Telephone Encounter (Signed)
He sees you on 9/19, does he need a sooner appt?

## 2021-01-06 NOTE — Telephone Encounter (Signed)
I can rx an antidpressant ,  i dont think [facility] OP clinic takes insured patients

## 2021-01-06 NOTE — Telephone Encounter (Signed)
Met with this pt on 01/04/21 and I know Kevin Oneill has assisted with resources for rental assistance. Are there any resources to assist with utilities (water & electric?

## 2021-01-08 MED ORDER — SERTRALINE HCL 50 MG PO TABS
50.0000 mg | ORAL_TABLET | Freq: Every day | ORAL | 3 refills | Status: DC
  Filled 2021-01-08 – 2021-01-24 (×4): qty 30, 30d supply, fill #0
  Filled 2021-03-08: qty 30, 30d supply, fill #1

## 2021-01-08 NOTE — Telephone Encounter (Signed)
Sertraline Rx for depression Kevin Oneill: pls sched this pt with me in August, Sept too far out

## 2021-01-08 NOTE — Addendum Note (Signed)
Addended by: Storm Frisk on: 01/08/2021 06:08 PM   Modules accepted: Orders

## 2021-01-09 ENCOUNTER — Other Ambulatory Visit: Payer: Self-pay

## 2021-01-12 NOTE — Telephone Encounter (Signed)
Thanks Dr. Wright!

## 2021-01-16 ENCOUNTER — Other Ambulatory Visit: Payer: Self-pay

## 2021-01-17 NOTE — Telephone Encounter (Signed)
Closed

## 2021-01-23 ENCOUNTER — Ambulatory Visit: Payer: 59 | Admitting: Clinical

## 2021-01-24 ENCOUNTER — Other Ambulatory Visit: Payer: Self-pay

## 2021-01-24 MED FILL — Levothyroxine Sodium Tab 137 MCG: ORAL | 30 days supply | Qty: 30 | Fill #2 | Status: AC

## 2021-01-26 ENCOUNTER — Other Ambulatory Visit: Payer: Self-pay

## 2021-02-01 ENCOUNTER — Ambulatory Visit: Payer: 59 | Attending: Critical Care Medicine | Admitting: Clinical

## 2021-02-01 ENCOUNTER — Other Ambulatory Visit: Payer: Self-pay

## 2021-02-01 DIAGNOSIS — F333 Major depressive disorder, recurrent, severe with psychotic symptoms: Secondary | ICD-10-CM

## 2021-02-01 DIAGNOSIS — F4001 Agoraphobia with panic disorder: Secondary | ICD-10-CM | POA: Diagnosis not present

## 2021-02-01 DIAGNOSIS — Z599 Problem related to housing and economic circumstances, unspecified: Secondary | ICD-10-CM | POA: Diagnosis not present
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
## 2021-02-15 ENCOUNTER — Ambulatory Visit: Payer: 59 | Admitting: Clinical

## 2021-03-08 ENCOUNTER — Other Ambulatory Visit: Payer: Self-pay

## 2021-03-08 MED FILL — Levothyroxine Sodium Tab 137 MCG: ORAL | 30 days supply | Qty: 30 | Fill #3 | Status: AC

## 2021-03-08 MED FILL — Insulin Glargine Soln Pen-Injector 100 Unit/ML: SUBCUTANEOUS | 25 days supply | Qty: 6 | Fill #0 | Status: CN

## 2021-03-15 ENCOUNTER — Other Ambulatory Visit: Payer: Self-pay

## 2021-03-20 ENCOUNTER — Ambulatory Visit: Payer: 59 | Admitting: Critical Care Medicine

## 2021-03-20 ENCOUNTER — Ambulatory Visit: Payer: 59 | Attending: Critical Care Medicine | Admitting: Clinical

## 2021-03-20 ENCOUNTER — Other Ambulatory Visit: Payer: Self-pay

## 2021-03-20 DIAGNOSIS — Z599 Problem related to housing and economic circumstances, unspecified: Secondary | ICD-10-CM

## 2021-03-20 DIAGNOSIS — F333 Major depressive disorder, recurrent, severe with psychotic symptoms: Secondary | ICD-10-CM

## 2021-03-20 DIAGNOSIS — F4001 Agoraphobia with panic disorder: Secondary | ICD-10-CM
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
## 2021-04-12 ENCOUNTER — Ambulatory Visit: Payer: 59 | Admitting: Clinical
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
## 2021-04-17 ENCOUNTER — Ambulatory Visit: Payer: 59 | Attending: Critical Care Medicine | Admitting: Critical Care Medicine

## 2021-04-17 ENCOUNTER — Other Ambulatory Visit: Payer: Self-pay

## 2021-04-17 ENCOUNTER — Other Ambulatory Visit: Payer: Self-pay | Admitting: Pharmacist

## 2021-04-17 ENCOUNTER — Encounter: Payer: Self-pay | Admitting: Critical Care Medicine

## 2021-04-17 ENCOUNTER — Other Ambulatory Visit: Payer: Self-pay | Admitting: Critical Care Medicine

## 2021-04-17 DIAGNOSIS — E039 Hypothyroidism, unspecified: Secondary | ICD-10-CM

## 2021-04-17 DIAGNOSIS — R1084 Generalized abdominal pain: Secondary | ICD-10-CM

## 2021-04-17 DIAGNOSIS — M5416 Radiculopathy, lumbar region: Secondary | ICD-10-CM

## 2021-04-17 DIAGNOSIS — E785 Hyperlipidemia, unspecified: Secondary | ICD-10-CM

## 2021-04-17 DIAGNOSIS — E1169 Type 2 diabetes mellitus with other specified complication: Secondary | ICD-10-CM | POA: Diagnosis not present

## 2021-04-17 DIAGNOSIS — M2391 Unspecified internal derangement of right knee: Secondary | ICD-10-CM

## 2021-04-17 DIAGNOSIS — M5126 Other intervertebral disc displacement, lumbar region: Secondary | ICD-10-CM

## 2021-04-17 DIAGNOSIS — M5020 Other cervical disc displacement, unspecified cervical region: Secondary | ICD-10-CM

## 2021-04-17 MED ORDER — GABAPENTIN 300 MG PO CAPS: ORAL_CAPSULE | Freq: Every day | ORAL | 2 refills | Status: DC

## 2021-04-17 MED ORDER — SERTRALINE HCL 50 MG PO TABS
50.0000 mg | ORAL_TABLET | Freq: Every day | ORAL | 3 refills | Status: DC
  Filled 2021-04-17: qty 30, 30d supply, fill #0

## 2021-04-17 MED ORDER — ATORVASTATIN CALCIUM 40 MG PO TABS
ORAL_TABLET | Freq: Every day | ORAL | 2 refills | Status: DC
  Filled 2021-04-17: qty 90, fill #0
  Filled 2021-05-29: qty 30, 30d supply, fill #0

## 2021-04-17 MED ORDER — BASAGLAR KWIKPEN 100 UNIT/ML ~~LOC~~ SOPN
25.0000 [IU] | PEN_INJECTOR | Freq: Every day | SUBCUTANEOUS | 2 refills | Status: DC
  Filled 2021-04-17: qty 15, 60d supply, fill #0

## 2021-04-17 MED ORDER — INSULIN GLARGINE-YFGN 100 UNIT/ML ~~LOC~~ SOPN
25.0000 [IU] | PEN_INJECTOR | Freq: Every day | SUBCUTANEOUS | 2 refills | Status: DC
  Filled 2021-04-17: qty 15, 60d supply, fill #0

## 2021-04-17 MED ORDER — INSULIN PEN NEEDLE 32G X 4 MM MISC
2 refills | Status: DC
  Filled 2021-04-17 – 2021-04-19 (×2): qty 100, 90d supply, fill #0

## 2021-04-17 MED ORDER — EMPAGLIFLOZIN 10 MG PO TABS
ORAL_TABLET | Freq: Every day | ORAL | 1 refills | Status: DC
  Filled 2021-05-29: qty 30, 30d supply, fill #0
  Filled 2021-07-03: qty 30, 30d supply, fill #1
  Filled 2021-08-09: qty 30, 30d supply, fill #0

## 2021-04-17 MED ORDER — LEVOTHYROXINE SODIUM 137 MCG PO TABS
ORAL_TABLET | ORAL | 3 refills | Status: DC
  Filled 2021-04-17: qty 30, 30d supply, fill #0
  Filled 2021-05-29: qty 30, 30d supply, fill #1
  Filled 2021-07-03: qty 30, 30d supply, fill #2
  Filled 2021-08-09: qty 30, 30d supply, fill #0

## 2021-04-17 NOTE — Assessment & Plan Note (Signed)
Patient was in the ER recently for this is has improved

## 2021-04-17 NOTE — Assessment & Plan Note (Signed)
Type 2 diabetes not well controlled we will refill with insulin Basaglar 25 units daily and Jardiance 10 mg daily and have the patient come in for short-term follow-up

## 2021-04-17 NOTE — Assessment & Plan Note (Signed)
Chronic spine and knee pain will refer to pain management

## 2021-04-17 NOTE — Assessment & Plan Note (Signed)
Refill thyroid medication

## 2021-04-18 ENCOUNTER — Other Ambulatory Visit: Payer: Self-pay

## 2021-04-19 ENCOUNTER — Other Ambulatory Visit: Payer: Self-pay

## 2021-04-26 ENCOUNTER — Other Ambulatory Visit: Payer: Self-pay

## 2021-05-22 ENCOUNTER — Encounter: Payer: Self-pay | Admitting: Gastroenterology

## 2021-05-29 ENCOUNTER — Other Ambulatory Visit: Payer: Self-pay

## 2021-05-29 ENCOUNTER — Encounter: Payer: Self-pay | Admitting: Critical Care Medicine

## 2021-05-29 ENCOUNTER — Ambulatory Visit: Payer: 59 | Attending: Critical Care Medicine | Admitting: Critical Care Medicine

## 2021-05-29 ENCOUNTER — Telehealth: Payer: Self-pay | Admitting: Critical Care Medicine

## 2021-05-29 VITALS — BP 126/84 | HR 96 | Resp 16 | Wt 203.8 lb

## 2021-05-29 DIAGNOSIS — E1159 Type 2 diabetes mellitus with other circulatory complications: Secondary | ICD-10-CM | POA: Insufficient documentation

## 2021-05-29 DIAGNOSIS — Z23 Encounter for immunization: Secondary | ICD-10-CM | POA: Diagnosis not present

## 2021-05-29 DIAGNOSIS — M5416 Radiculopathy, lumbar region: Secondary | ICD-10-CM

## 2021-05-29 DIAGNOSIS — Z79899 Other long term (current) drug therapy: Secondary | ICD-10-CM | POA: Diagnosis not present

## 2021-05-29 DIAGNOSIS — R072 Precordial pain: Secondary | ICD-10-CM

## 2021-05-29 DIAGNOSIS — E039 Hypothyroidism, unspecified: Secondary | ICD-10-CM | POA: Diagnosis not present

## 2021-05-29 DIAGNOSIS — M545 Low back pain, unspecified: Secondary | ICD-10-CM | POA: Insufficient documentation

## 2021-05-29 DIAGNOSIS — Z794 Long term (current) use of insulin: Secondary | ICD-10-CM | POA: Insufficient documentation

## 2021-05-29 DIAGNOSIS — Z9641 Presence of insulin pump (external) (internal): Secondary | ICD-10-CM | POA: Insufficient documentation

## 2021-05-29 DIAGNOSIS — E113293 Type 2 diabetes mellitus with mild nonproliferative diabetic retinopathy without macular edema, bilateral: Secondary | ICD-10-CM | POA: Diagnosis not present

## 2021-05-29 DIAGNOSIS — M5020 Other cervical disc displacement, unspecified cervical region: Secondary | ICD-10-CM

## 2021-05-29 DIAGNOSIS — E782 Mixed hyperlipidemia: Secondary | ICD-10-CM

## 2021-05-29 DIAGNOSIS — R079 Chest pain, unspecified: Secondary | ICD-10-CM | POA: Diagnosis present

## 2021-05-29 DIAGNOSIS — E785 Hyperlipidemia, unspecified: Secondary | ICD-10-CM

## 2021-05-29 DIAGNOSIS — G8929 Other chronic pain: Secondary | ICD-10-CM | POA: Diagnosis not present

## 2021-05-29 DIAGNOSIS — E1169 Type 2 diabetes mellitus with other specified complication: Secondary | ICD-10-CM

## 2021-05-29 DIAGNOSIS — M2391 Unspecified internal derangement of right knee: Secondary | ICD-10-CM

## 2021-05-29 DIAGNOSIS — E113219 Type 2 diabetes mellitus with mild nonproliferative diabetic retinopathy with macular edema, unspecified eye: Secondary | ICD-10-CM | POA: Insufficient documentation

## 2021-05-29 LAB — POCT GLYCOSYLATED HEMOGLOBIN (HGB A1C): HbA1c, POC (controlled diabetic range): 8.6 % — AB (ref 0.0–7.0)

## 2021-05-29 LAB — GLUCOSE, POCT (MANUAL RESULT ENTRY): POC Glucose: 182 mg/dl — AB (ref 70–99)

## 2021-05-29 MED ORDER — INSULIN GLARGINE-YFGN 100 UNIT/ML ~~LOC~~ SOPN
30.0000 [IU] | PEN_INJECTOR | Freq: Every day | SUBCUTANEOUS | 2 refills | Status: DC
  Filled 2021-05-29: qty 15, 50d supply, fill #0
  Filled 2021-08-09 (×2): qty 9, 30d supply, fill #0

## 2021-05-29 NOTE — Assessment & Plan Note (Signed)
Pain clinic management

## 2021-05-29 NOTE — Assessment & Plan Note (Signed)
Continue lipid therapy 

## 2021-05-29 NOTE — Assessment & Plan Note (Addendum)
Precordial chest pain I suspect musculoskeletal in nature however would like cardiology's intervention at this time to see if coronary CT scanning is indicated given his diabetes  Note EKG today was normal

## 2021-05-29 NOTE — Assessment & Plan Note (Signed)
Continue thyroid supplement 

## 2021-05-29 NOTE — Patient Instructions (Addendum)
Increase insulin glargine to 30 units daily  No change in other medications  Refills on medicines sent to our pharmacy  Pneumonia vaccine was given  Continue to follow a healthy diet as we discussed and as attached  Continue to follow-up with the pain management clinic  Referral to cardiology was made  Our goal for your hemoglobin A1c is to be 7 or less we need to follow a very healthy diet with fresh fruits vegetables lean meats as per the attachment and per our discussion  Our goal is to have fasting blood sugars in the 1 10-1 30 range and after eating to be no higher than 180  See our clinical pharmacist Franky Macho in 2 to 3 weeks for follow-up and see Dr. Delford Field in 2 months Franky Macho is our clinical pharmacist who will assist you in getting you to goal for quickly with your diabetes  Blood work today included lipid panel metabolic panel and urine for protein  Referral to diabetic retinal specialist was made with your diabetic eye disease

## 2021-05-29 NOTE — Progress Notes (Signed)
Patient states he has chest pain and it radiates towards his shoulder.

## 2021-05-29 NOTE — Assessment & Plan Note (Signed)
Plan to increase Basaglar to 30 units daily  We discussed improvements in diet that are needed

## 2021-05-29 NOTE — Assessment & Plan Note (Signed)
Management per pain clinic 

## 2021-05-29 NOTE — Telephone Encounter (Signed)
Pt would like an appt with social work to address depression please assist
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
## 2021-06-08 LAB — COMPREHENSIVE METABOLIC PANEL
ALT: 29 IU/L (ref 0–44)
AST: 25 IU/L (ref 0–40)
Albumin/Globulin Ratio: 1.6 (ref 1.2–2.2)
Albumin: 4.8 g/dL (ref 3.8–4.9)
Alkaline Phosphatase: 63 IU/L (ref 44–121)
BUN/Creatinine Ratio: 17 (ref 9–20)
BUN: 23 mg/dL (ref 6–24)
Bilirubin Total: 0.5 mg/dL (ref 0.0–1.2)
CO2: 26 mmol/L (ref 20–29)
Calcium: 9.6 mg/dL (ref 8.7–10.2)
Chloride: 99 mmol/L (ref 96–106)
Creatinine, Ser: 1.32 mg/dL — ABNORMAL HIGH (ref 0.76–1.27)
Globulin, Total: 3 g/dL (ref 1.5–4.5)
Glucose: 161 mg/dL — ABNORMAL HIGH (ref 70–99)
Potassium: 4.5 mmol/L (ref 3.5–5.2)
Sodium: 139 mmol/L (ref 134–144)
Total Protein: 7.8 g/dL (ref 6.0–8.5)
eGFR: 64 mL/min/{1.73_m2} (ref >59)

## 2021-06-08 LAB — LIPID PANEL
Chol/HDL Ratio: 4.3 ratio (ref 0.0–5.0)
Cholesterol, Total: 221 mg/dL — ABNORMAL HIGH (ref 100–199)
HDL: 51 mg/dL (ref >39)
LDL Chol Calc (NIH): 146 mg/dL — ABNORMAL HIGH (ref 0–99)
Triglycerides: 136 mg/dL (ref 0–149)
VLDL Cholesterol Cal: 24 mg/dL (ref 5–40)

## 2021-06-08 LAB — MICROALBUMIN / CREATININE URINE RATIO

## 2021-06-09 ENCOUNTER — Telehealth: Payer: Self-pay

## 2021-06-09 NOTE — Telephone Encounter (Signed)
-----   Message from Storm Frisk, MD sent at 06/08/2021  3:30 PM EST ----- Let pt know labs are back, cholesterol was high, liver , kidney normal,  his urine microalbumin has to be recollected.  No change in medicatoins from Oct 17 visit

## 2021-06-09 NOTE — Telephone Encounter (Signed)
Pt was called and is aware of results, DOB was confirmed.  ?

## 2021-06-13 NOTE — Progress Notes (Unsigned)
° ° °  S:    PCP: Dr. Delford Field  Patient presents for diabetes evaluation, education, and management. Patient was referred and last seen by Primary Care Provider on 05/29/21. At that time, Semglee was increased.   Today, *** home BG?  Had constipation and abd pain with metformin GLP1? LDL elevated - incr atorva + add zetia -sees [facility] again 1/30  Family/Social History:  -Former smoker -DM and HTN in mother  Insurance coverage/medication affordability: [employer] plan  Medication adherence reported *** .   Current diabetes medications include: Semglee (insulin glargine) 30 units daily, Jardiance 10 mg daily Current hypertension medications include: none Current hyperlipidemia medications include: atorvastatin 40 mg daily  Patient {Actions; denies-reports:120008} hypoglycemic events.  Patient reported dietary habits: Eats *** meals/day Breakfast:*** Lunch:*** Dinner:*** Snacks:*** Drinks:***  Patient-reported exercise habits: ***   Patient {Actions; denies-reports:120008} nocturia (nighttime urination).  Patient {Actions; denies-reports:120008} neuropathy (nerve pain). Patient {Actions; denies-reports:120008} visual changes. Patient {Actions; denies-reports:120008} self foot exams.     O:  POCT BG:   Lab Results  Component Value Date   HGBA1C 8.6 (A) 05/29/2021   There were no vitals filed for this visit.  Lipid Panel     Component Value Date/Time   CHOL 221 (H) 05/29/2021 1116   TRIG 136 05/29/2021 1116   HDL 51 05/29/2021 1116   CHOLHDL 4.3 05/29/2021 1116   CHOLHDL 6.1 03/20/2020 2004   VLDL 25 03/20/2020 2004   LDLCALC 146 (H) 05/29/2021 1116    Home fasting blood sugars: ***  2 hour post-meal/random blood sugars: ***.  Clinical Atherosclerotic Cardiovascular Disease (ASCVD): No  The 10-year ASCVD risk score (Arnett DK, et al., 2019) is: 12.3%   Values used to calculate the score:     Age: 4 years     Sex: Male     Is Non-Hispanic African  American: Yes     Diabetic: Yes     Tobacco smoker: No     Systolic Blood Pressure: 126 mmHg     Is BP treated: No     HDL Cholesterol: 51 mg/dL     Total Cholesterol: 221 mg/dL   A/P: Diabetes longstanding currently not at goal given A1c 8.6 on 05/29/21. Patient is able to verbalize appropriate hypoglycemia management plan. Medication adherence appears ***. Control is suboptimal due to ***. -*** -Extensively discussed pathophysiology of diabetes, recommended lifestyle interventions, dietary effects on blood sugar control -Counseled on s/sx of and management of hypoglycemia -Next A1C anticipated 08/2021.   ASCVD risk - primary***secondary prevention in patient with diabetes. Last LDL {Is/is not:9024} controlled. ASCVD risk score {Is/is not:9024} >20%  - {Desc; low/moderate/high:110033} intensity statin indicated. Aspirin {Is/is not:9024} indicated.  -{Meds adjust:18428} aspirin *** mg  -{Meds adjust:18428} ***statin *** mg.   Hypertension longstanding*** currently ***.  Blood pressure goal = *** mmHg. Medication adherence ***.  Blood pressure control is suboptimal due to ***. -***  Total time in face to face counseling *** minutes.   Follow up Pharmacist/PCP*** Clinic Visit in ***.   Patient seen with ***

## 2021-06-14 ENCOUNTER — Ambulatory Visit: Payer: 59 | Admitting: Pharmacist

## 2021-06-16 ENCOUNTER — Ambulatory Visit: Payer: 59 | Admitting: Gastroenterology

## 2021-07-03 ENCOUNTER — Other Ambulatory Visit: Payer: Self-pay

## 2021-07-11 NOTE — Telephone Encounter (Signed)
I spoke with pt and scheduled appt for 07/31/21.  °
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
## 2021-07-18 ENCOUNTER — Ambulatory Visit: Payer: 59 | Admitting: Physician Assistant

## 2021-07-18 DIAGNOSIS — Z87891 Personal history of nicotine dependence: Secondary | ICD-10-CM

## 2021-07-18 DIAGNOSIS — R072 Precordial pain: Secondary | ICD-10-CM

## 2021-07-18 DIAGNOSIS — E785 Hyperlipidemia, unspecified: Secondary | ICD-10-CM
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
## 2021-07-31 ENCOUNTER — Ambulatory Visit: Payer: 59 | Admitting: Clinical

## 2021-07-31 ENCOUNTER — Ambulatory Visit: Payer: 59 | Admitting: Critical Care Medicine

## 2021-08-01 ENCOUNTER — Ambulatory Visit: Payer: 59 | Admitting: Gastroenterology

## 2021-08-09 ENCOUNTER — Other Ambulatory Visit: Payer: Self-pay

## 2021-08-09 ENCOUNTER — Ambulatory Visit: Payer: Self-pay | Attending: Critical Care Medicine | Admitting: Clinical

## 2021-08-09 DIAGNOSIS — Z599 Problem related to housing and economic circumstances, unspecified: Secondary | ICD-10-CM

## 2021-08-09 DIAGNOSIS — F333 Major depressive disorder, recurrent, severe with psychotic symptoms: Secondary | ICD-10-CM

## 2021-08-10 ENCOUNTER — Other Ambulatory Visit: Payer: Self-pay
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
## 2021-08-17 ENCOUNTER — Other Ambulatory Visit: Payer: Self-pay

## 2021-08-18 ENCOUNTER — Telehealth: Payer: Self-pay | Admitting: Clinical

## 2021-08-18 NOTE — Telephone Encounter (Signed)
I spoke with pt and he stated that he no longer needed assistance via legal aid due to dss helping him.
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
## 2021-09-04 ENCOUNTER — Ambulatory Visit (INDEPENDENT_AMBULATORY_CARE_PROVIDER_SITE_OTHER): Payer: 59

## 2021-09-04 ENCOUNTER — Other Ambulatory Visit: Payer: Self-pay

## 2021-09-04 ENCOUNTER — Telehealth: Payer: Self-pay | Admitting: Clinical

## 2021-09-04 ENCOUNTER — Ambulatory Visit (INDEPENDENT_AMBULATORY_CARE_PROVIDER_SITE_OTHER): Payer: 59 | Admitting: Nurse Practitioner

## 2021-09-04 ENCOUNTER — Encounter: Payer: Self-pay | Admitting: Nurse Practitioner

## 2021-09-04 ENCOUNTER — Ambulatory Visit: Payer: Self-pay | Admitting: Clinical

## 2021-09-04 VITALS — BP 122/76 | HR 79 | Ht 69.0 in | Wt 219.0 lb

## 2021-09-04 DIAGNOSIS — E782 Mixed hyperlipidemia: Secondary | ICD-10-CM

## 2021-09-04 DIAGNOSIS — R072 Precordial pain: Secondary | ICD-10-CM

## 2021-09-04 DIAGNOSIS — R002 Palpitations: Secondary | ICD-10-CM | POA: Diagnosis not present

## 2021-09-04 DIAGNOSIS — E119 Type 2 diabetes mellitus without complications: Secondary | ICD-10-CM

## 2021-09-04 LAB — BASIC METABOLIC PANEL
BUN/Creatinine Ratio: 12 (ref 9–20)
BUN: 14 mg/dL (ref 6–24)
CO2: 27 mmol/L (ref 20–29)
Calcium: 9.7 mg/dL (ref 8.7–10.2)
Chloride: 101 mmol/L (ref 96–106)
Creatinine, Ser: 1.15 mg/dL (ref 0.76–1.27)
Glucose: 153 mg/dL — ABNORMAL HIGH (ref 70–99)
Potassium: 4.3 mmol/L (ref 3.5–5.2)
Sodium: 139 mmol/L (ref 134–144)
eGFR: 75 mL/min/{1.73_m2} (ref >59)

## 2021-09-04 LAB — TSH: TSH: 2.95 u[IU]/mL (ref 0.450–4.500)

## 2021-09-04 MED ORDER — NITROGLYCERIN 0.4 MG SL SUBL: 0.4000 mg | SUBLINGUAL_TABLET | SUBLINGUAL | 12 refills | Status: DC | PRN

## 2021-09-04 NOTE — Patient Instructions (Addendum)
Medication Instructions:  ?Your physician recommends that you continue on your current medications as directed. Please refer to the Current Medication list given to you today.  ? ?*If you need a refill on your cardiac medications before your next appointment, please call your pharmacy* ? ? ?Lab Work: ?Your physician recommends that you complete labs today ?BMET ?TSH ? ?If you have labs (blood work) drawn today and your tests are completely normal, you will receive your results only by: ?MyChart Message (if you have MyChart) OR ?A paper copy in the mail ?If you have any lab test that is abnormal or we need to change your treatment, we will call you to review the results. ? ? ?Testing/Procedures: ?Your physician has requested that you have an echocardiogram. Echocardiography is a painless test that uses sound waves to create images of your heart. It provides your doctor with information about the size and shape of your heart and how well your heart?s chambers and valves are working. This procedure takes approximately one hour. There are no restrictions for this procedure.  ? ?ZIO AT Long term monitor-Live Telemetry ? ?Your physician has requested you wear a ZIO patch monitor for 14 days.  ?This is a single patch monitor. [employer] supplies one patch monitor per enrollment. Additional  ?stickers are not available.  ?Please do not apply patch if you will be having a Nuclear Stress Test, Echocardiogram, Cardiac CT, MRI,  ?or Chest Xray during the period you would be wearing the monitor. The patch cannot be worn during  ?these tests. You cannot remove and re-apply the ZIO AT patch monitor.  ?Your ZIO patch monitor will be mailed 3 day USPS to your address on file. It may take 3-5 days to  ?receive your monitor after you have been enrolled.  ?Once you have received your monitor, please review the enclosed instructions. Your monitor has  ?already been registered assigning a specific monitor serial # to you.  ? ?Billing and  Patient Assistance Program information ? ?[employer] has been supplied with any insurance information on record for billing. ?[employer] offers a sliding scale Patient Assistance Program for patients without insurance, or whose  ?insurance does not completely cover the cost of the ZIO patch monitor. You must apply for the  ?Patient Assistance Program to qualify for the discounted rate. To apply, call [employer] at [phone],  ?select option 4, select option 2 , ask to apply for the Patient Assistance Program, (you can request an  ?interpreter if needed). [employer] will ask your household income and how many people are in your  ?household. [employer] will quote your out-of-pocket cost based on this information. They will also be able  ?to set up a 12 month interest free payment plan if needed. ? ?Applying the monitor  ? ?Shave hair from upper left chest.  ?Hold the abrader disc by orange tab. Rub the abrader in 40 strokes over left upper chest as indicated in  ?your monitor instructions.  ?Clean area with 4 enclosed alcohol pads. Use all pads to ensure the area is cleaned thoroughly. Let  ?dry.  ?Apply patch as indicated in monitor instructions. Patch will be placed under collarbone on left side of  ?chest with arrow pointing upward.  ?Rub patch adhesive wings for 2 minutes. Remove the white label marked "1". Remove the white label  ?marked "2". Rub patch adhesive wings for 2 additional minutes.  ?While looking in a mirror, press and release button in center of patch. A small green light will  flash 3-4  ?times. This will be your only indicator that the monitor has been turned on.  ?Do not shower for the first 24 hours. You may shower after the first 24 hours.  ?Press the button if you feel a symptom. You will hear a small click. Record Date, Time and Symptom in  ?the Patient Log.  ? ?Starting the Gateway ? ?In your kit there is a small plastic box the size of a cellphone. This is Airline pilot. It transmits all your  ?recorded  data to [employer]. This box must always stay within 10 feet of you. Open the box and push the *  ?button. There will be a light that blinks orange and then green a few times. When the light stops  ?blinking, the Gateway is connected to the ZIO patch. ?Call [employer] at [phone] to confirm your monitor is transmitting. ? ?Returning your monitor ? ?Remove your patch and place it inside the Gateway. In the lower half of the Gateway there is a white  ?bag with prepaid postage on it. Place Gateway in bag and seal. Mail package back to [place] as soon as  ?possible. Your physician should have your final report approximately 7 days after you have mailed back  ?your monitor. ?Call [facility] at [phone] if you have questions regarding your ZIO AT  ?patch monitor. Call them immediately if you see an orange light blinking on your monitor.  ?If your monitor falls off in less than 4 days, contact our Monitor department at [phone]. If your  ?monitor becomes loose or falls off after 4 days call [employer] at [phone] for suggestions on  ?securing your monitor  ? ? ?Follow-Up: ?At [facility], you and your health needs are our priority.  As part of our continuing mission to provide you with exceptional heart care, we have created designated Provider Care Teams.  These Care Teams include your primary Cardiologist (physician) and Advanced Practice Providers (APPs -  Physician Assistants and Nurse Practitioners) who all work together to provide you with the care you need, when you need it. ? ?We recommend signing up for the patient portal called "MyChart".  Sign up information is provided on this After Visit Summary.  MyChart is used to connect with patients for Virtual Visits (Telemedicine).  Patients are able to view lab/test results, encounter notes, upcoming appointments, etc.  Non-urgent messages can be sent to your provider as well.   ?To learn more about what you can do with MyChart, go  to [email or web address].   ? ?Your next appointment:   ?6-8 week(s) ? ?The format for your next appointment:   ?In Person ? ?Provider:   ?Kirk Ruths, MD  or Diona Browner, NP      ? ? ? ? ?

## 2021-09-04 NOTE — Telephone Encounter (Signed)
I spoke to reschedule appt as he missed his appt at 2:30. He also mentioned that he's still waiting to receive assistance with DSS. He is requesting for legal aid referral to be made. He mentioned that his court date is on 09/20/21. I will completed a legal aid referral. I also rescheduled appt for 09/27/21. ?

## 2021-09-04 NOTE — Progress Notes (Unsigned)
Enrolled patient for a 14 day Zio XT monitor to be mailed to patients home  [place] to read 

## 2021-09-08 NOTE — Progress Notes (Signed)
Left message to call back  
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
## 2021-09-11 ENCOUNTER — Ambulatory Visit: Payer: 59 | Attending: Critical Care Medicine | Admitting: Critical Care Medicine

## 2021-09-11 ENCOUNTER — Other Ambulatory Visit: Payer: Self-pay

## 2021-09-11 ENCOUNTER — Encounter: Payer: Self-pay | Admitting: Critical Care Medicine

## 2021-09-11 VITALS — BP 128/80 | HR 98 | Resp 16 | Ht 69.0 in | Wt 214.0 lb

## 2021-09-11 DIAGNOSIS — M5416 Radiculopathy, lumbar region: Secondary | ICD-10-CM

## 2021-09-11 DIAGNOSIS — E1169 Type 2 diabetes mellitus with other specified complication: Secondary | ICD-10-CM | POA: Diagnosis not present

## 2021-09-11 DIAGNOSIS — E039 Hypothyroidism, unspecified: Secondary | ICD-10-CM | POA: Diagnosis not present

## 2021-09-11 DIAGNOSIS — E785 Hyperlipidemia, unspecified: Secondary | ICD-10-CM | POA: Diagnosis not present

## 2021-09-11 LAB — POCT GLYCOSYLATED HEMOGLOBIN (HGB A1C)
HbA1c POC (<> result, manual entry): 9.7 % (ref 4.0–5.6)
Hemoglobin A1C: 9.7 % — AB (ref 4.0–5.6)

## 2021-09-11 MED ORDER — INSULIN PEN NEEDLE 32G X 4 MM MISC: 2 refills | Status: DC

## 2021-09-11 MED ORDER — EMPAGLIFLOZIN 25 MG PO TABS: 25.0000 mg | ORAL_TABLET | Freq: Every day | ORAL | 1 refills | Status: DC

## 2021-09-11 MED ORDER — LEVOTHYROXINE SODIUM 137 MCG PO TABS: ORAL_TABLET | ORAL | 3 refills | Status: DC

## 2021-09-11 MED ORDER — SERTRALINE HCL 50 MG PO TABS: 50.0000 mg | ORAL_TABLET | Freq: Every day | ORAL | 3 refills | Status: DC

## 2021-09-11 MED ORDER — PREGABALIN 75 MG PO CAPS: 75.0000 mg | ORAL_CAPSULE | Freq: Every day | ORAL | 2 refills | Status: DC

## 2021-09-11 MED ORDER — ATORVASTATIN CALCIUM 80 MG PO TABS: 80.0000 mg | ORAL_TABLET | Freq: Every day | ORAL | 2 refills | Status: DC

## 2021-09-11 MED ORDER — INSULIN GLARGINE-YFGN 100 UNIT/ML ~~LOC~~ SOPN: 35.0000 [IU] | PEN_INJECTOR | Freq: Every day | SUBCUTANEOUS | 2 refills | Status: DC

## 2021-09-11 NOTE — Assessment & Plan Note (Signed)
No change in thyroid dosing ?

## 2021-09-11 NOTE — Assessment & Plan Note (Signed)
Type 2 diabetes not at goal ? ?Plan to increase Semglee to 35 units daily increase Jardiance 25 mg daily ? ?Plan to get patient back in with clinical pharmacy in 3 weeks ? ?Patient given a lifestyle medicine handout to improve his diet he was asked to focus on the diet section of this handout ?

## 2021-09-11 NOTE — Assessment & Plan Note (Signed)
Plan to refill Lyrica which is helped in the past with his back pain and neuropathy ?

## 2021-09-11 NOTE — Assessment & Plan Note (Signed)
Continue statin therapy.

## 2021-09-11 NOTE — Patient Instructions (Signed)
Follow healthy diet on hand out ? ?Increase Semglee to 35 units daily ? ?Increase jardiance 25mg  daily ? ?No other medication changes ? ?Refills sent to your pharmacy ? ?Make sure you get an eye appointment this year ? ?Return Dr Joya Gaskins 4 months ? ?See Lurena Joiner our clinical pharmacist in 3 weeks ? ? ?

## 2021-09-13 ENCOUNTER — Other Ambulatory Visit (HOSPITAL_BASED_OUTPATIENT_CLINIC_OR_DEPARTMENT_OTHER): Payer: 59

## 2021-09-13 ENCOUNTER — Encounter (HOSPITAL_COMMUNITY): Payer: Self-pay | Admitting: Nurse Practitioner

## 2021-09-27 ENCOUNTER — Ambulatory Visit: Payer: 59 | Admitting: Clinical

## 2021-10-03 ENCOUNTER — Ambulatory Visit: Payer: 59 | Admitting: Pharmacist

## 2021-10-16 ENCOUNTER — Ambulatory Visit: Payer: 59 | Admitting: Nurse Practitioner
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
## 2021-10-20 ENCOUNTER — Encounter: Payer: Self-pay | Admitting: Nurse Practitioner

## 2021-11-02 ENCOUNTER — Telehealth: Payer: Self-pay

## 2021-11-02 NOTE — Telephone Encounter (Signed)
Spoke with pt. Pt was notified of monitor results. Pt was asked to complete echocardiogram and schedule a follow up in our office. ?
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
## 2022-01-15 ENCOUNTER — Ambulatory Visit: Payer: 59 | Admitting: Critical Care Medicine

## 2022-03-06 ENCOUNTER — Telehealth: Payer: Self-pay | Admitting: Critical Care Medicine

## 2022-03-06 DIAGNOSIS — R072 Precordial pain: Secondary | ICD-10-CM

## 2022-03-06 DIAGNOSIS — R002 Palpitations: Secondary | ICD-10-CM

## 2022-03-06 DIAGNOSIS — E785 Hyperlipidemia, unspecified: Secondary | ICD-10-CM

## 2022-03-06 DIAGNOSIS — E119 Type 2 diabetes mellitus without complications: Secondary | ICD-10-CM

## 2022-03-06 DIAGNOSIS — E782 Mixed hyperlipidemia: Secondary | ICD-10-CM

## 2022-03-06 DIAGNOSIS — E039 Hypothyroidism, unspecified: Secondary | ICD-10-CM

## 2022-03-06 NOTE — Telephone Encounter (Signed)
Medication Refill - Medication: atorvastatin (LIPITOR) 80 MG tablet [[id number]] ,nitroGLYCERIN (NITROSTAT) 0.4 MG SL tablet [[id number]], levothyroxine (SYNTHROID) 137 MCG tablet [[id number]]   Has the patient contacted their pharmacy? Yes.   (Agent: If no, request that the patient contact the pharmacy for the refill. If patient does not wish to contact the pharmacy document the reason why and proceed with request.) (Agent: If yes, when and what did the pharmacy advise?)  Preferred Pharmacy (with phone number or street name): [facility] [address] [place] [place] [address]   appointment in the last year OR does the patient have an upcoming appointment? Yes.    Agent: Please be advised that RX refills may take up to 3 business days. We ask that you follow-up with your pharmacy.

## 2022-03-07 MED ORDER — ATORVASTATIN CALCIUM 80 MG PO TABS: 80.0000 mg | ORAL_TABLET | Freq: Every day | ORAL | 0 refills | Status: DC

## 2022-03-07 MED ORDER — NITROGLYCERIN 0.4 MG SL SUBL: 0.4000 mg | SUBLINGUAL_TABLET | SUBLINGUAL | 0 refills | Status: DC | PRN

## 2022-03-07 MED ORDER — LEVOTHYROXINE SODIUM 137 MCG PO TABS: ORAL_TABLET | ORAL | 0 refills | Status: DC

## 2022-03-07 NOTE — Telephone Encounter (Signed)
Patient has no-showed his last three OV's. Will send in 30 day supplies but he must make an appointment.

## 2022-03-12 ENCOUNTER — Other Ambulatory Visit: Payer: Self-pay | Admitting: Critical Care Medicine

## 2022-03-12 DIAGNOSIS — E039 Hypothyroidism, unspecified: Secondary | ICD-10-CM

## 2022-06-04 ENCOUNTER — Ambulatory Visit: Payer: Self-pay | Attending: Nurse Practitioner | Admitting: Nurse Practitioner

## 2022-06-04 ENCOUNTER — Ambulatory Visit: Payer: Self-pay

## 2022-06-04 ENCOUNTER — Encounter: Payer: Self-pay | Admitting: Nurse Practitioner

## 2022-06-04 VITALS — BP 130/85 | HR 82 | Wt 204.8 lb

## 2022-06-04 DIAGNOSIS — E785 Hyperlipidemia, unspecified: Secondary | ICD-10-CM

## 2022-06-04 DIAGNOSIS — D72829 Elevated white blood cell count, unspecified: Secondary | ICD-10-CM

## 2022-06-04 DIAGNOSIS — E039 Hypothyroidism, unspecified: Secondary | ICD-10-CM

## 2022-06-04 DIAGNOSIS — E1169 Type 2 diabetes mellitus with other specified complication: Secondary | ICD-10-CM

## 2022-06-04 LAB — POCT GLYCOSYLATED HEMOGLOBIN (HGB A1C): HbA1c, POC (controlled diabetic range): 10.2 % — AB (ref 0.0–7.0)

## 2022-06-04 LAB — GLUCOSE, POCT (MANUAL RESULT ENTRY): POC Glucose: 290 mg/dl — AB (ref 70–99)

## 2022-06-04 MED ORDER — BASAGLAR KWIKPEN 100 UNIT/ML ~~LOC~~ SOPN: 35.0000 [IU] | PEN_INJECTOR | Freq: Two times a day (BID) | SUBCUTANEOUS | 3 refills | Status: DC

## 2022-06-04 MED ORDER — ATORVASTATIN CALCIUM 80 MG PO TABS: 80.0000 mg | ORAL_TABLET | Freq: Every day | ORAL | 3 refills | Status: DC

## 2022-06-04 MED ORDER — LEVOTHYROXINE SODIUM 137 MCG PO TABS: 137.0000 ug | ORAL_TABLET | Freq: Every day | ORAL | 1 refills | Status: DC
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
## 2022-06-04 NOTE — Telephone Encounter (Signed)
  Chief Complaint: Depression, sciatic pain and groin irritation. Symptoms: Above Frequency: Ongoing Pertinent Negatives: Patient denies self harm Disposition: [] ED /[] Urgent Care (no appt availability in office) / [x] Appointment(In office/virtual)/ []  [facility]/ [] Home Care/ [] Refused Recommended Disposition /[] [facility]/ []  Follow-up with PCP Additional Notes: Received call from a [employer] representative doing a home health visit. Permission given to speak to [place]. Pt states that the depression medication he is taking makes him sleepy. Sciatic pain is not controlled. And pt stopped taking Jardiance d/t groin irritation. Pt is out of some medications.    Reason for Disposition  [1] Depression AND [2] worsening (e.g., sleeping poorly, less able to do activities of daily living)  Answer Assessment - Initial Assessment Questions 1. CONCERN: "What happened that made you call today?"     Visit from [facility] rep 2. DEPRESSION SYMPTOM SCREENING: "How are you feeling overall?" (e.g., decreased energy, increased sleeping or difficulty sleeping, difficulty concentrating, feelings of sadness, guilt, hopelessness, or worthlessness)      3. RISK OF HARM - SUICIDAL IDEATION:  "Do you ever have thoughts of hurting or killing yourself?"  (e.g., yes, no, no but preoccupation with thoughts about death)   - INTENT:  "Do you have thoughts of hurting or killing yourself right NOW?" (e.g., yes, no, N/A)   - PLAN: "Do you have a specific plan for how you would do this?" (e.g., gun, knife, overdose, no plan, N/A)      4. RISK OF HARM - HOMICIDAL IDEATION:  "Do you ever have thoughts of hurting or killing someone else?"  (e.g., yes, no, no but preoccupation with thoughts about death)   - INTENT:  "Do you have thoughts of hurting or killing someone right NOW?" (e.g., yes, no, N/A)   - PLAN: "Do you have a specific plan for how you would do this?" (e.g., gun, knife, no plan, N/A)       5.  FUNCTIONAL IMPAIRMENT: "How have things been going for you overall? Have you had more difficulty than usual doing your normal daily activities?"  (e.g., better, same, worse; self-care, school, work, interactions)      6. SUPPORT: "Who is with you now?" "Who do you live with?" "Do you have family or friends who you can talk to?"       7. THERAPIST: "Do you have a counselor or therapist? Name?"      8. STRESSORS: "Has there been any new stress or recent changes in your life?"      9. ALCOHOL USE OR SUBSTANCE USE (DRUG USE): "Do you drink alcohol or use any illegal drugs?"      10. OTHER: "Do you have any other physical symptoms right now?" (e.g., fever)        11. PREGNANCY: "Is there any chance you are pregnant?" "When was your last menstrual period?"  Protocols used: Depression-A-AH

## 2022-06-05 LAB — CMP14+EGFR
ALT: 18 IU/L (ref 0–44)
AST: 18 IU/L (ref 0–40)
Albumin/Globulin Ratio: 1.6 (ref 1.2–2.2)
Albumin: 4.6 g/dL (ref 3.8–4.9)
Alkaline Phosphatase: 62 IU/L (ref 44–121)
BUN/Creatinine Ratio: 9 (ref 9–20)
BUN: 10 mg/dL (ref 6–24)
Bilirubin Total: 0.4 mg/dL (ref 0.0–1.2)
CO2: 24 mmol/L (ref 20–29)
Calcium: 9.3 mg/dL (ref 8.7–10.2)
Chloride: 100 mmol/L (ref 96–106)
Creatinine, Ser: 1.1 mg/dL (ref 0.76–1.27)
Globulin, Total: 2.9 g/dL (ref 1.5–4.5)
Glucose: 271 mg/dL — ABNORMAL HIGH (ref 70–99)
Potassium: 4.2 mmol/L (ref 3.5–5.2)
Sodium: 140 mmol/L (ref 134–144)
Total Protein: 7.5 g/dL (ref 6.0–8.5)
eGFR: 79 mL/min/{1.73_m2} (ref >59)

## 2022-06-05 LAB — THYROID PANEL WITH TSH
Free Thyroxine Index: 2.1 (ref 1.2–4.9)
T3 Uptake Ratio: 29 % (ref 24–39)
T4, Total: 7.1 ug/dL (ref 4.5–12.0)
TSH: 2.71 u[IU]/mL (ref 0.450–4.500)

## 2022-06-05 LAB — CBC WITH DIFFERENTIAL/PLATELET
Basophils Absolute: 0 10*3/uL (ref 0.0–0.2)
Basos: 1 %
EOS (ABSOLUTE): 0.1 10*3/uL (ref 0.0–0.4)
Eos: 1 %
Hematocrit: 43.9 % (ref 37.5–51.0)
Hemoglobin: 15 g/dL (ref 13.0–17.7)
Immature Grans (Abs): 0 10*3/uL (ref 0.0–0.1)
Immature Granulocytes: 0 %
Lymphocytes Absolute: 2.2 10*3/uL (ref 0.7–3.1)
Lymphs: 27 %
MCH: 28.7 pg (ref 26.6–33.0)
MCHC: 34.2 g/dL (ref 31.5–35.7)
MCV: 84 fL (ref 79–97)
Monocytes Absolute: 0.5 10*3/uL (ref 0.1–0.9)
Monocytes: 6 %
Neutrophils Absolute: 5.4 10*3/uL (ref 1.4–7.0)
Neutrophils: 65 %
Platelets: 273 10*3/uL (ref 150–450)
RBC: 5.23 x10E6/uL (ref 4.14–5.80)
RDW: 11.6 % (ref 11.6–15.4)
WBC: 8.3 10*3/uL (ref 3.4–10.8)

## 2022-06-05 LAB — MICROALBUMIN / CREATININE URINE RATIO
Creatinine, Urine: 141.3 mg/dL
Microalb/Creat Ratio: 6 mg/g creat (ref 0–29)
Microalbumin, Urine: 8.2 ug/mL

## 2022-06-11 ENCOUNTER — Telehealth: Payer: Self-pay

## 2022-06-11 NOTE — Telephone Encounter (Signed)
Pt was called and is aware of results, DOB was confirmed.  ?

## 2022-06-11 NOTE — Telephone Encounter (Signed)
-----   Message from Claiborne Rigg, NP sent at 06/10/2022  9:24 AM EST ----- Normal thyroid levels. Continue current dose of thyroid medication.   Kidney, liver function and electrolytes are normal.    CBC does not indicate any anemia or bleeding disorders   Urine  does not show any microscopic diabetic kidney changes.
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
## 2022-07-24 ENCOUNTER — Other Ambulatory Visit: Payer: Self-pay

## 2022-07-24 ENCOUNTER — Encounter: Payer: Self-pay | Admitting: Critical Care Medicine

## 2022-07-24 ENCOUNTER — Ambulatory Visit: Payer: Commercial Managed Care - HMO | Attending: Critical Care Medicine | Admitting: Critical Care Medicine

## 2022-07-24 VITALS — BP 125/71 | HR 79 | Wt 206.2 lb

## 2022-07-24 DIAGNOSIS — E785 Hyperlipidemia, unspecified: Secondary | ICD-10-CM

## 2022-07-24 DIAGNOSIS — E1169 Type 2 diabetes mellitus with other specified complication: Secondary | ICD-10-CM | POA: Diagnosis not present

## 2022-07-24 DIAGNOSIS — E039 Hypothyroidism, unspecified: Secondary | ICD-10-CM | POA: Diagnosis not present

## 2022-07-24 DIAGNOSIS — B351 Tinea unguium: Secondary | ICD-10-CM

## 2022-07-24 LAB — GLUCOSE, POCT (MANUAL RESULT ENTRY): POC Glucose: 155 mg/dl — AB (ref 70–99)

## 2022-07-24 MED ORDER — LEVOTHYROXINE SODIUM 137 MCG PO TABS: 137.0000 ug | ORAL_TABLET | Freq: Every day | ORAL | 1 refills | Status: DC

## 2022-07-24 MED ORDER — PREGABALIN 75 MG PO CAPS
75.0000 mg | ORAL_CAPSULE | Freq: Every day | ORAL | 2 refills | Status: DC
  Filled 2022-07-24: qty 30, 30d supply, fill #0
  Filled 2022-09-19: qty 30, 30d supply, fill #1
  Filled 2023-02-13: qty 30, 30d supply, fill #2

## 2022-07-24 MED ORDER — INSULIN PEN NEEDLE 32G X 4 MM MISC
2 refills | Status: DC
  Filled 2022-07-24: qty 100, 30d supply, fill #0
  Filled 2022-09-19: qty 100, 30d supply, fill #1

## 2022-07-24 MED ORDER — ESCITALOPRAM OXALATE 10 MG PO TABS
10.0000 mg | ORAL_TABLET | Freq: Every day | ORAL | 2 refills | Status: DC
  Filled 2022-07-24: qty 30, 30d supply, fill #0
  Filled 2022-09-19: qty 30, 30d supply, fill #1
  Filled 2023-02-13: qty 30, 30d supply, fill #2

## 2022-07-24 MED ORDER — PANTOPRAZOLE SODIUM 40 MG PO TBEC
40.0000 mg | DELAYED_RELEASE_TABLET | Freq: Every day | ORAL | 3 refills | Status: DC
  Filled 2022-07-24: qty 30, 30d supply, fill #0
  Filled 2022-09-19: qty 30, 30d supply, fill #1
  Filled 2023-02-13: qty 30, 30d supply, fill #2

## 2022-07-24 MED ORDER — ONETOUCH DELICA PLUS LANCET33G MISC
12 refills | Status: DC
  Filled 2022-07-24: qty 100, 25d supply, fill #0
  Filled 2022-09-19: qty 100, 30d supply, fill #1
  Filled 2023-02-13: qty 100, 30d supply, fill #2

## 2022-07-24 MED ORDER — ACCU-CHEK GUIDE VI STRP
ORAL_STRIP | 12 refills | Status: AC
  Filled 2022-07-24: qty 100, 25d supply, fill #0
  Filled 2022-09-19: qty 100, 30d supply, fill #1
  Filled 2023-02-13: qty 100, 30d supply, fill #2

## 2022-07-24 MED ORDER — BASAGLAR KWIKPEN 100 UNIT/ML ~~LOC~~ SOPN
35.0000 [IU] | PEN_INJECTOR | Freq: Two times a day (BID) | SUBCUTANEOUS | 3 refills | Status: DC
  Filled 2022-07-24: qty 21, 30d supply, fill #0
  Filled 2022-09-19: qty 21, 30d supply, fill #1
  Filled 2023-02-13: qty 21, 30d supply, fill #2

## 2022-07-24 MED ORDER — ONETOUCH VERIO FLEX SYSTEM W/DEVICE KIT
PACK | 0 refills | Status: AC
  Filled 2022-07-24: qty 1, 30d supply, fill #0

## 2022-07-24 NOTE — Patient Instructions (Signed)
Resume insulin 35 units daily it is only $25 co-pay for 1 month supply at our pharmacy downstairs  Start Protonix 40 mg daily for acid  No other medication changes  A new glucose meter testing supplies and lancets were sent to our pharmacy downstairs  Return to see Lurena Joiner our clinical pharmacist in 6 weeks for your diabetes  Return to see Dr. Joya Gaskins in 3 months

## 2022-07-25 DIAGNOSIS — B351 Tinea unguium: Secondary | ICD-10-CM | POA: Insufficient documentation

## 2022-07-25 NOTE — Assessment & Plan Note (Signed)
Referral to podiatry made

## 2022-07-25 NOTE — Assessment & Plan Note (Signed)
Due to lack of access of insulin we discussed with clinical pharmacy today he is available to receive insulin at our pharmacy at $25 a month he will go there going forward for his insulin products.  Dose of long-acting insulin will be increased he will see clinical pharmacy in short-term follow-up

## 2022-07-25 NOTE — Assessment & Plan Note (Signed)
-

## 2022-08-20 IMAGING — CT CT RENAL STONE PROTOCOL
2 of 4 series · 16 of 46 positions shown, 18 images · non-contrast
Comparison: 03/20/2020

CLINICAL DATA: Left flank pain since 4 a.m. today. No history of
stones.

EXAM:
CT ABDOMEN AND PELVIS WITHOUT CONTRAST
TECHNIQUE: Multidetector CT imaging of the abdomen and pelvis was performed
following the standard protocol without IV contrast.

[Series 2: axial st · axial · 0.84mm/px · z∈[-544,-114]mm · 13 of 98 slices shown, 15 images]
[im 6/98  soft-tissue]
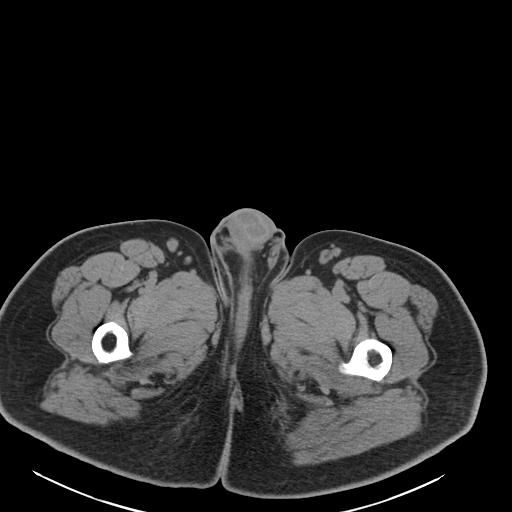
[im 6/98  bone]
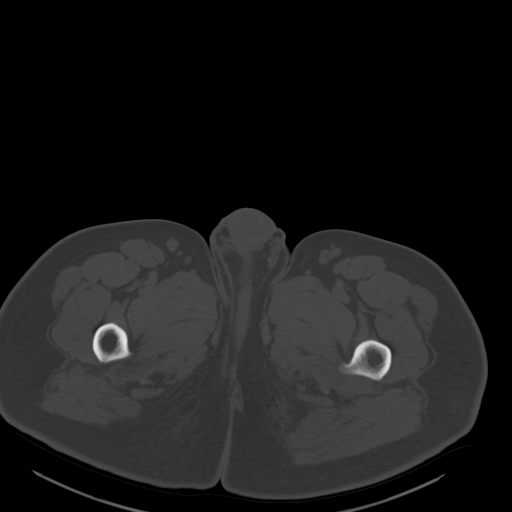
[im 12/98  soft-tissue]
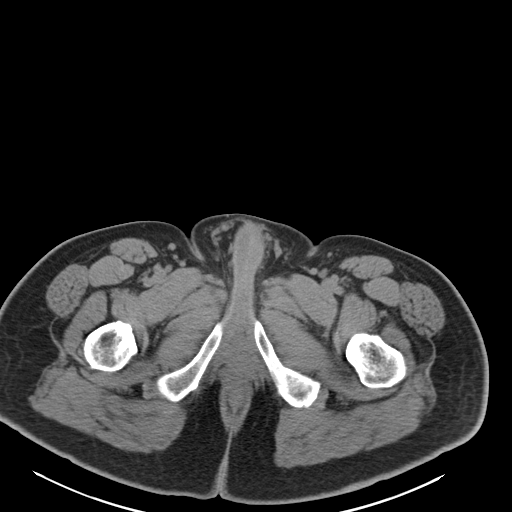
[im 23/98  soft-tissue]
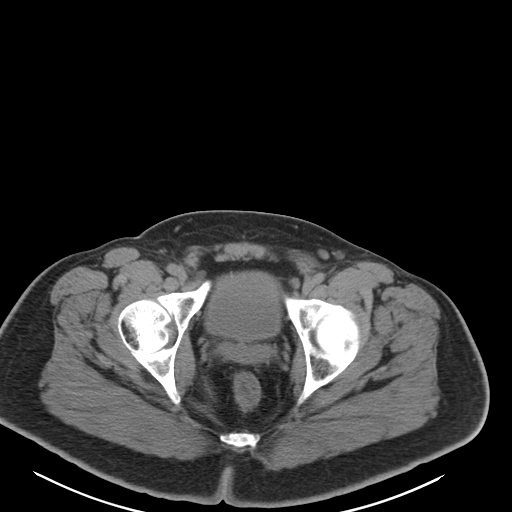
[im 29/98  soft-tissue]
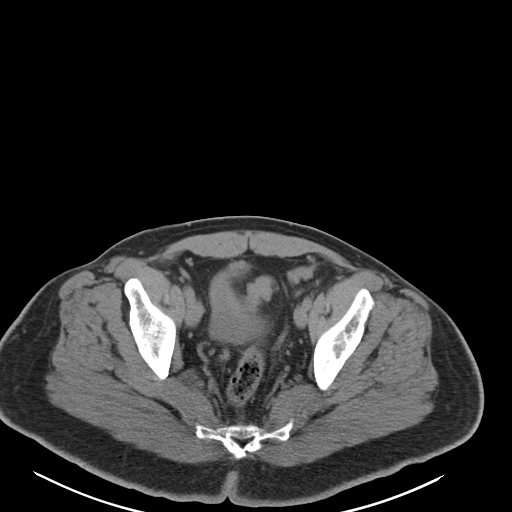
[im 35/98  soft-tissue]
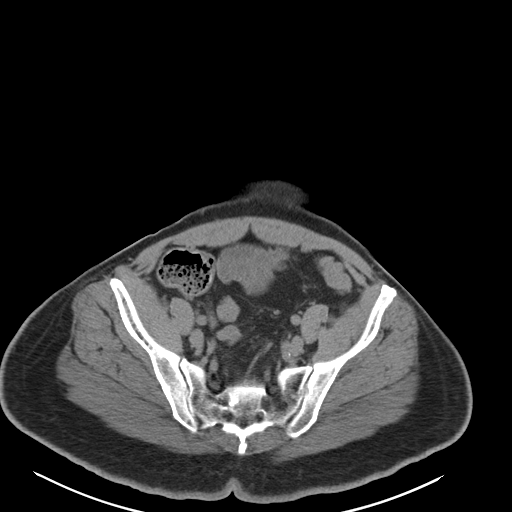
[im 40/98  soft-tissue]
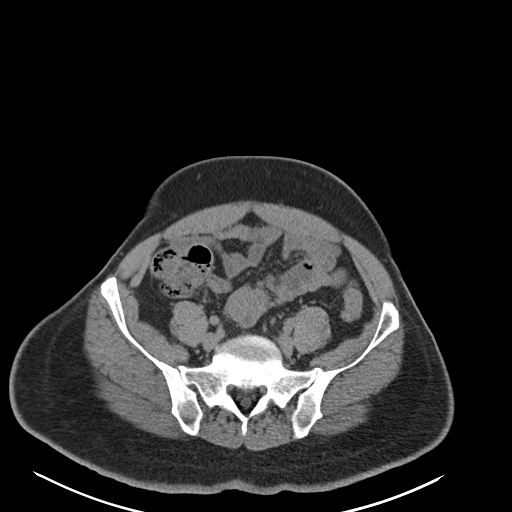
[im 52/98  soft-tissue]
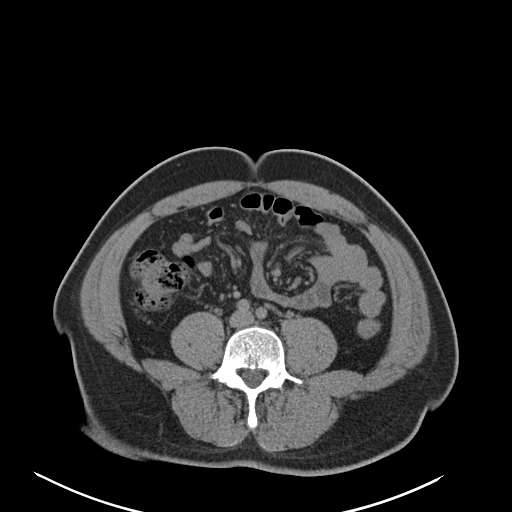
[im 58/98  soft-tissue]
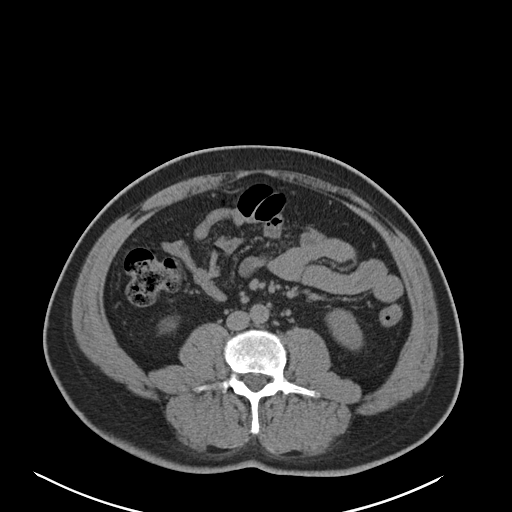
[im 63/98  soft-tissue]
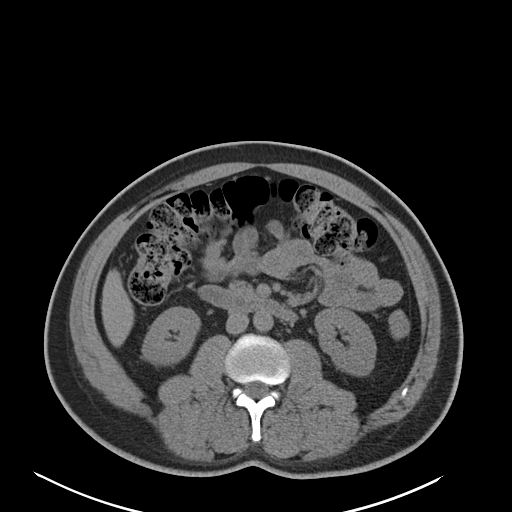
[im 63/98  bone]
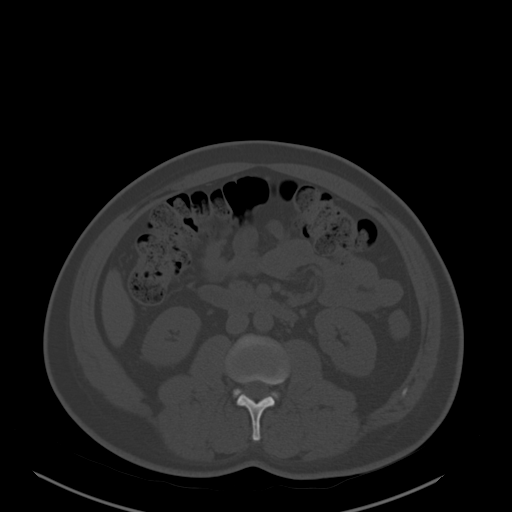
[im 69/98  soft-tissue]
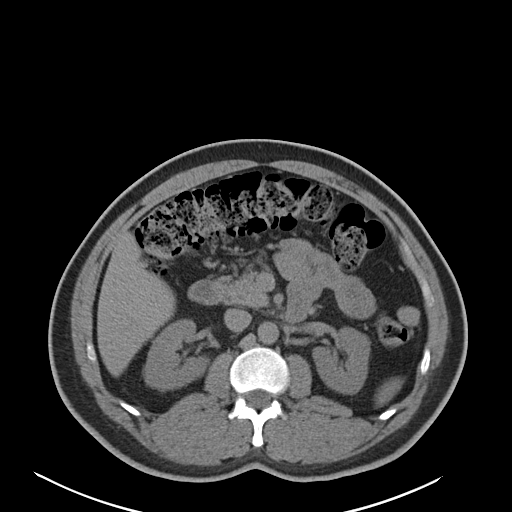
[im 75/98  soft-tissue]
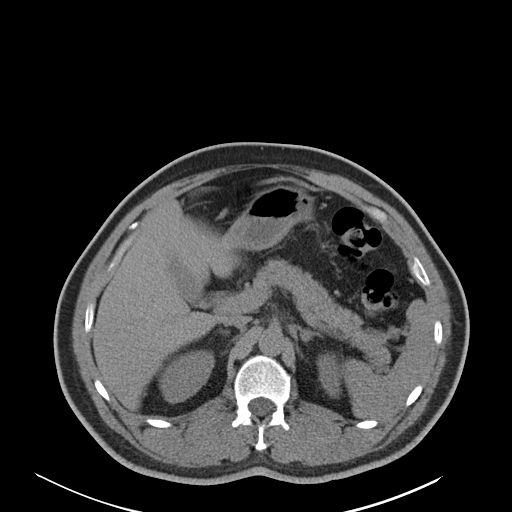
[im 86/98  soft-tissue]
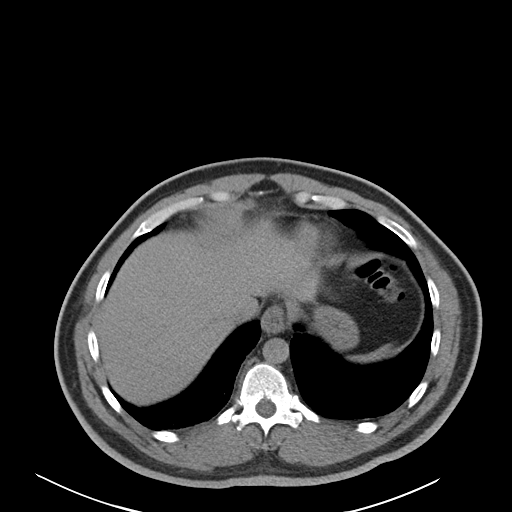
[im 92/98  soft-tissue]
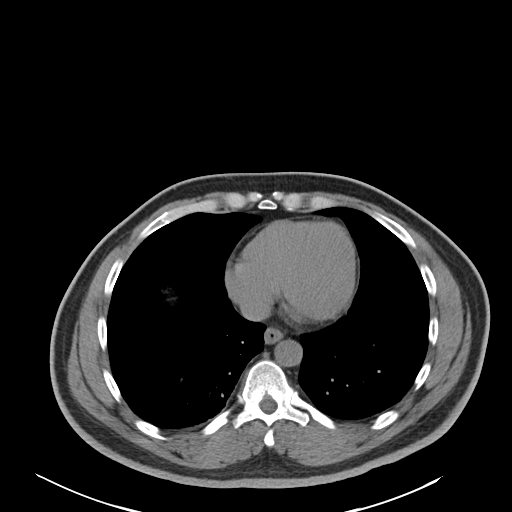

[Series 5: coronal st · coronal · 0.98mm/px · 3 of 150 slices shown]
[im 50/150  soft-tissue]
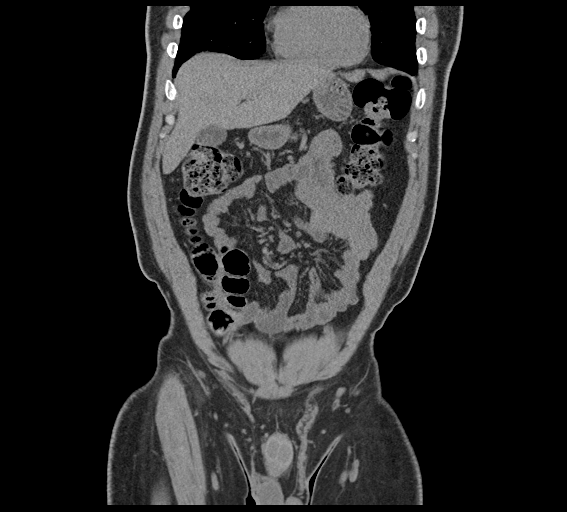
[im 67/150  soft-tissue]
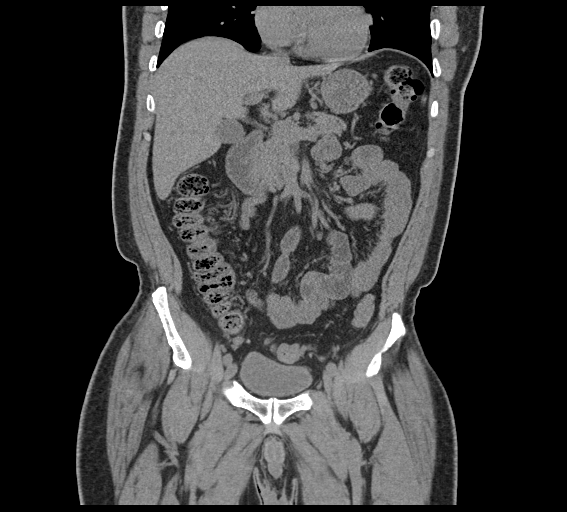
[im 83/150  soft-tissue]
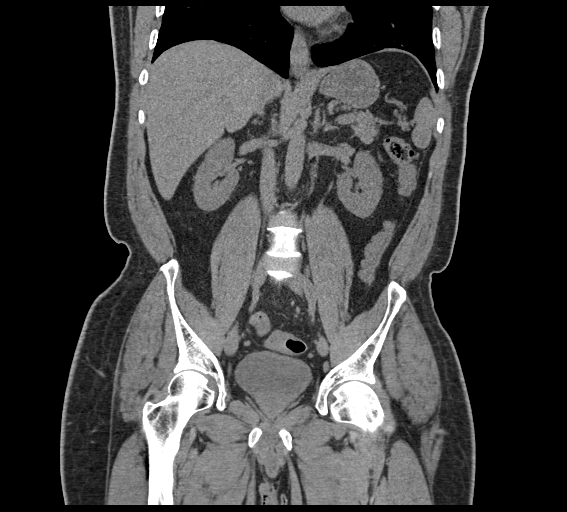

[16 of 46 positions shown; findings below may reference images not displayed]

FINDINGS: Lower chest: Minimal subsegmental atelectasis. Lung bases otherwise
clear.

Hepatobiliary: No focal liver abnormality is seen. No gallstones,
gallbladder wall thickening, or biliary dilatation.

Pancreas: Unremarkable. No pancreatic ductal dilatation or
surrounding inflammatory changes.

Spleen: Normal in size without focal abnormality.

Adrenals/Urinary Tract: Adrenal glands are unremarkable. Kidneys are
normal, without renal calculi, focal lesion, or hydronephrosis.
Bladder is unremarkable.

Stomach/Bowel: Normal stomach. Normal small bowel. Colon is normal
in caliber. No colonic wall thickening or inflammation. Mild
increased colonic stool burden most evident in the right and
transverse colon. Appendix is prominent, but unchanged from the
prior CT, with no associated inflammation. This is presumed a normal
variant in this patient.

Vascular/Lymphatic: No significant vascular findings are present. No
enlarged abdominal or pelvic lymph nodes.

Reproductive: Unremarkable.

Other: No abdominal wall hernia or abnormality. No abdominopelvic
ascites.

Musculoskeletal: No acute or significant osseous findings.
IMPRESSION: 1. No acute findings. No renal or ureteral stones or obstructive
uropathy. No findings to account for left flank pain.
2. Mild increase in colonic stool burden. No bowel obstruction or
inflammation.

## 2022-08-27 ENCOUNTER — Ambulatory Visit: Payer: Commercial Managed Care - HMO | Admitting: Pharmacist

## 2022-09-04 ENCOUNTER — Telehealth: Payer: Self-pay | Admitting: Cardiology

## 2022-09-04 NOTE — Telephone Encounter (Signed)
Patient states he was returning call. Please advise ?

## 2022-09-04 NOTE — Telephone Encounter (Signed)
Spoke to patient  . Patient states he does not remember speaking to any member of the office yesterday about results of his monitor.  Result given. Patient states the monitor malfunction and he  was waiting for someone to send him a new one.   RN mention patient  to continue with having a echo . Patient state she does not have an appointment schedule.  RN will let  Provider be aware.

## 2022-09-19 ENCOUNTER — Other Ambulatory Visit: Payer: Self-pay

## 2022-10-01 ENCOUNTER — Ambulatory Visit: Payer: Commercial Managed Care - HMO | Admitting: Pharmacist

## 2022-10-02 ENCOUNTER — Other Ambulatory Visit: Payer: Self-pay

## 2022-10-05 ENCOUNTER — Other Ambulatory Visit: Payer: Self-pay | Admitting: Pharmacist

## 2022-10-05 ENCOUNTER — Other Ambulatory Visit: Payer: Self-pay

## 2022-10-05 ENCOUNTER — Ambulatory Visit: Payer: Commercial Managed Care - HMO | Admitting: Pharmacist

## 2022-10-05 DIAGNOSIS — E039 Hypothyroidism, unspecified: Secondary | ICD-10-CM

## 2022-10-05 MED ORDER — LEVOTHYROXINE SODIUM 137 MCG PO TABS
137.0000 ug | ORAL_TABLET | Freq: Every day | ORAL | 2 refills | Status: DC
  Filled 2022-10-05 – 2023-02-13 (×2): qty 30, 30d supply, fill #0

## 2022-10-12 ENCOUNTER — Other Ambulatory Visit: Payer: Self-pay

## 2022-10-25 ENCOUNTER — Telehealth: Payer: Commercial Managed Care - HMO | Admitting: Pharmacist
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
## 2022-11-13 ENCOUNTER — Ambulatory Visit: Payer: Commercial Managed Care - HMO | Admitting: Critical Care Medicine

## 2023-02-13 ENCOUNTER — Other Ambulatory Visit: Payer: Self-pay

## 2023-03-21 ENCOUNTER — Other Ambulatory Visit: Payer: Self-pay | Admitting: Critical Care Medicine

## 2023-03-21 DIAGNOSIS — E119 Type 2 diabetes mellitus without complications: Secondary | ICD-10-CM

## 2023-03-21 DIAGNOSIS — E782 Mixed hyperlipidemia: Secondary | ICD-10-CM

## 2023-03-21 DIAGNOSIS — R072 Precordial pain: Secondary | ICD-10-CM

## 2023-03-21 DIAGNOSIS — R002 Palpitations: Secondary | ICD-10-CM

## 2023-05-07 ENCOUNTER — Encounter: Payer: Self-pay | Admitting: Nurse Practitioner

## 2023-05-07 ENCOUNTER — Ambulatory Visit: Payer: Managed Care, Other (non HMO) | Attending: Nurse Practitioner | Admitting: Nurse Practitioner

## 2023-05-07 VITALS — BP 103/68 | HR 81 | Ht 69.0 in | Wt 211.8 lb

## 2023-05-07 DIAGNOSIS — F333 Major depressive disorder, recurrent, severe with psychotic symptoms: Secondary | ICD-10-CM

## 2023-05-07 DIAGNOSIS — E119 Type 2 diabetes mellitus without complications: Secondary | ICD-10-CM | POA: Diagnosis not present

## 2023-05-07 DIAGNOSIS — D72829 Elevated white blood cell count, unspecified: Secondary | ICD-10-CM

## 2023-05-07 DIAGNOSIS — E1169 Type 2 diabetes mellitus with other specified complication: Secondary | ICD-10-CM

## 2023-05-07 DIAGNOSIS — E039 Hypothyroidism, unspecified: Secondary | ICD-10-CM

## 2023-05-07 DIAGNOSIS — R350 Frequency of micturition: Secondary | ICD-10-CM | POA: Diagnosis not present

## 2023-05-07 DIAGNOSIS — R072 Precordial pain: Secondary | ICD-10-CM

## 2023-05-07 DIAGNOSIS — E782 Mixed hyperlipidemia: Secondary | ICD-10-CM

## 2023-05-07 DIAGNOSIS — M5416 Radiculopathy, lumbar region: Secondary | ICD-10-CM

## 2023-05-07 DIAGNOSIS — K219 Gastro-esophageal reflux disease without esophagitis: Secondary | ICD-10-CM

## 2023-05-07 DIAGNOSIS — R002 Palpitations: Secondary | ICD-10-CM

## 2023-05-07 DIAGNOSIS — E038 Other specified hypothyroidism: Secondary | ICD-10-CM

## 2023-05-07 DIAGNOSIS — Z794 Long term (current) use of insulin: Secondary | ICD-10-CM

## 2023-05-07 DIAGNOSIS — E785 Hyperlipidemia, unspecified: Secondary | ICD-10-CM

## 2023-05-07 MED ORDER — ESCITALOPRAM OXALATE 10 MG PO TABS: 10.0000 mg | ORAL_TABLET | Freq: Every day | ORAL | 1 refills | Status: AC

## 2023-05-07 MED ORDER — PREGABALIN 75 MG PO CAPS: 75.0000 mg | ORAL_CAPSULE | Freq: Two times a day (BID) | ORAL | 0 refills | Status: DC

## 2023-05-07 MED ORDER — NITROGLYCERIN 0.4 MG SL SUBL: 0.4000 mg | SUBLINGUAL_TABLET | SUBLINGUAL | 0 refills | Status: AC | PRN

## 2023-05-07 MED ORDER — ASPIRIN 81 MG PO TBEC
81.0000 mg | DELAYED_RELEASE_TABLET | Freq: Every day | ORAL | 3 refills | Status: AC
Start: 2023-05-07 — End: ?

## 2023-05-07 MED ORDER — LEVOTHYROXINE SODIUM 137 MCG PO TABS: 137.0000 ug | ORAL_TABLET | Freq: Every day | ORAL | 1 refills | Status: AC

## 2023-05-07 MED ORDER — ATORVASTATIN CALCIUM 80 MG PO TABS: 80.0000 mg | ORAL_TABLET | Freq: Every day | ORAL | 3 refills | Status: AC

## 2023-05-07 MED ORDER — ONETOUCH DELICA PLUS LANCET33G MISC: 12 refills | Status: AC

## 2023-05-07 MED ORDER — INSULIN PEN NEEDLE 32G X 4 MM MISC: 2 refills | Status: AC

## 2023-05-07 MED ORDER — PANTOPRAZOLE SODIUM 40 MG PO TBEC
40.0000 mg | DELAYED_RELEASE_TABLET | Freq: Every day | ORAL | 3 refills | Status: AC
Start: 2023-05-07 — End: ?

## 2023-05-07 MED ORDER — BASAGLAR KWIKPEN 100 UNIT/ML ~~LOC~~ SOPN: 35.0000 [IU] | PEN_INJECTOR | Freq: Two times a day (BID) | SUBCUTANEOUS | 3 refills | Status: AC
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
## 2023-05-07 NOTE — Progress Notes (Signed)
Constipation  Podiatry

## 2023-05-07 NOTE — Patient Instructions (Addendum)
Placed in [facility] [address] [place], [place] [address] [place] [phone]  Placed in [facility]  [address]  [place], [place] [address] Phone: [phone]

## 2023-05-08 LAB — CBC WITH DIFFERENTIAL/PLATELET
Basophils Absolute: 0 10*3/uL (ref 0.0–0.2)
Basos: 1 %
EOS (ABSOLUTE): 0.2 10*3/uL (ref 0.0–0.4)
Eos: 2 %
Hematocrit: 43.1 % (ref 37.5–51.0)
Hemoglobin: 14.3 g/dL (ref 13.0–17.7)
Immature Grans (Abs): 0 10*3/uL (ref 0.0–0.1)
Immature Granulocytes: 0 %
Lymphocytes Absolute: 2.7 10*3/uL (ref 0.7–3.1)
Lymphs: 36 %
MCH: 28.2 pg (ref 26.6–33.0)
MCHC: 33.2 g/dL (ref 31.5–35.7)
MCV: 85 fL (ref 79–97)
Monocytes Absolute: 0.6 10*3/uL (ref 0.1–0.9)
Monocytes: 8 %
Neutrophils Absolute: 4.1 10*3/uL (ref 1.4–7.0)
Neutrophils: 53 %
Platelets: 278 10*3/uL (ref 150–450)
RBC: 5.07 x10E6/uL (ref 4.14–5.80)
RDW: 11.6 % (ref 11.6–15.4)
WBC: 7.7 10*3/uL (ref 3.4–10.8)

## 2023-05-08 LAB — CMP14+EGFR
ALT: 26 [IU]/L (ref 0–44)
AST: 23 [IU]/L (ref 0–40)
Albumin: 4.3 g/dL (ref 3.8–4.9)
Alkaline Phosphatase: 64 [IU]/L (ref 44–121)
BUN/Creatinine Ratio: 8 — ABNORMAL LOW (ref 9–20)
BUN: 9 mg/dL (ref 6–24)
Bilirubin Total: 0.4 mg/dL (ref 0.0–1.2)
CO2: 24 mmol/L (ref 20–29)
Calcium: 9 mg/dL (ref 8.7–10.2)
Chloride: 104 mmol/L (ref 96–106)
Creatinine, Ser: 1.16 mg/dL (ref 0.76–1.27)
Globulin, Total: 2.6 g/dL (ref 1.5–4.5)
Glucose: 183 mg/dL — ABNORMAL HIGH (ref 70–99)
Potassium: 4 mmol/L (ref 3.5–5.2)
Sodium: 143 mmol/L (ref 134–144)
Total Protein: 6.9 g/dL (ref 6.0–8.5)
eGFR: 73 mL/min/{1.73_m2} (ref >59)

## 2023-05-08 LAB — MICROALBUMIN / CREATININE URINE RATIO
Creatinine, Urine: 277.8 mg/dL
Microalb/Creat Ratio: 6 mg/g{creat} (ref 0–29)
Microalbumin, Urine: 18 ug/mL

## 2023-05-08 LAB — PSA: Prostate Specific Ag, Serum: 0.4 ng/mL (ref 0.0–4.0)

## 2023-05-08 LAB — THYROID PANEL WITH TSH
Free Thyroxine Index: 1.6 (ref 1.2–4.9)
T3 Uptake Ratio: 27 % (ref 24–39)
T4, Total: 6 ug/dL (ref 4.5–12.0)
TSH: 12.8 u[IU]/mL — ABNORMAL HIGH (ref 0.450–4.500)

## 2023-05-08 LAB — HEMOGLOBIN A1C
Est. average glucose Bld gHb Est-mCnc: 223 mg/dL
Hgb A1c MFr Bld: 9.4 % — ABNORMAL HIGH (ref 4.8–5.6)

## 2023-05-10 NOTE — Progress Notes (Deleted)
Cardiology Office Note:    Date:  05/10/2023   ID:  Lyna Poser, DOB 04/01/1966, MRN [id number]  PCP:  Storm Frisk, MD  Cardiologist:  Olga Millers, MD { Click to update primary MD,subspecialty MD or APP then REFRESH:1}    Referring MD: Storm Frisk, MD   Chief Complaint: follow-up of atypical chest pain and palpitations  History of Present Illness:    Kevin Oneill is a 57 y.o. male with a history of atypical chest pain with normal coronaries on coronary CTA in 05/2020, palpitations with one episode of PAT and rare PACs/ PVCs noted on monitor in 08/2021, hyperlipidemia, type 2 diabetes mellitus, hypothyroidism, and prior tobacco abuse who is followed by Dr. Jens Som and presents today for routine follow-up.   Patient was first seen by Cardiology during a hospitalization in 03/2020 for a right groin abscess. During that admission, he reported atypical chest pain. Echo showed LVEF of 60-65% with normal wall motion and no significant valvular disease. Outpatient coronary CTA was recommended and completed in 05/2020 which showed a coronary calcium score of 0 with no evidence of CAD.   He was last seen by Bernadene Person, NP, in 08/2022 after not being seen since 2021 at which time he reported daily palpitations with associated nausea and diaphoresis. He also reported atypical chest pain at that time with associated numbness and tingling in his hands. Zio monitor was ordered for further evaluation of palpitations and showed underlying sinus rhythm with one 16 beat run of PAT and rare PACs/ PVCs. Echo was recommended for further evaluation of atypical chest pain but it does not look like this was ever done.  Patient presents today for follow-up. ***  Atypical Chest Pain Patient has a history of atypical chest pain. Coronary CTA in 05/2020 showed a coronary calcium score of 0 with no evidence of CAD.  - ***  Palpitations History of palpitations. Monitor in 08/2021 showed one 16  beat run of PAT and rare PACs/ PVCs.  - ***  Hyperlipidemia Lipid panel in *** - Continue Lipitor 80mg  daily.  - Labs followed by PCP.  Type 2 Diabetes Mellitus - Uncontrolled  Hemoglobin A1c 9.4 on 05/07/2023.  - Management per PCP.   EKGs/Labs/Other Studies Reviewed:    The following studies were reviewed:  Echocardiogram 03/21/2020: Impressions: 1. Left ventricular ejection fraction, by estimation, is 60 to 65%. The  left ventricle has normal function. The left ventricle has no regional  wall motion abnormalities. Left ventricular diastolic function could not  be evaluated.   2. Right ventricular systolic function is normal. The right ventricular  size is normal. Tricuspid regurgitation signal is inadequate for assessing  PA pressure.   3. The mitral valve is normal in structure. Trivial mitral valve  regurgitation. No evidence of mitral stenosis.   4. The aortic valve is normal in structure. Aortic valve regurgitation is  not visualized. No aortic stenosis is present.   5. The inferior vena cava is normal in size with <50% respiratory  variability, suggesting right atrial pressure of 8 mmHg.  _______________  Coronary CTA 05/06/2020: Impression: 1. Coronary calcium score of 0. This was 0 percentile for age and sex matched control. 2. Normal coronary origin with right dominance. 3. No evidence of CAD; CAD-RADS 0. _______________  Monitor 09/10/2021 to 09/11/2021: Patient had a min HR of 64 bpm, max HR of 143 bpm, and avg HR of 84 bpm. Predominant underlying rhythm was Sinus Rhythm. Slight P wave morphology  changes were noted. 1 run of Supraventricular Tachycardia occurred lasting 16 beats with a max rate of 143  bpm (avg 123 bpm). Isolated SVEs were rare (<1.0%), and no SVE Couplets or SVE Triplets were present. Isolated VEs were rare (<1.0%), and no VE Couplets or VE Triplets were present.   NSR, sinus tachycardia, rare PAC; one 16 beat run of PAT; rare PVC  EKG:  EKG   ordered today. EKG personally reviewed and demonstrates ***.  Recent Labs: 05/07/2023: ALT 26; BUN 9; Creatinine, Ser 1.16; Hemoglobin 14.3; Platelets 278; Potassium 4.0; Sodium 143; TSH 12.800  Recent Lipid Panel    Component Value Date/Time   CHOL 221 (H) 05/29/2021 1116   TRIG 136 05/29/2021 1116   HDL 51 05/29/2021 1116   CHOLHDL 4.3 05/29/2021 1116   CHOLHDL 6.1 03/20/2020 2004   VLDL 25 03/20/2020 2004   LDLCALC 146 (H) 05/29/2021 1116    Physical Exam:    Vital Signs: There were no vitals taken for this visit.    Wt Readings from Last 3 Encounters:  05/07/23 211 lb 12.8 oz (96.1 kg)  07/24/22 206 lb 3.2 oz (93.5 kg)  06/04/22 204 lb 12.8 oz (92.9 kg)     General: 57 y.o. male in no acute distress. HEENT: Normocephalic and atraumatic. Sclera clear.  Neck: Supple. No carotid bruits. No JVD. Heart: *** RRR. Distinct S1 and S2. No murmurs, gallops, or rubs.  Lungs: No increased work of breathing. Clear to ausculation bilaterally. No wheezes, rhonchi, or rales.  Abdomen: Soft, non-distended, and non-tender to palpation.  Extremities: No lower extremity edema.  Radial and distal pedal pulses 2+ and equal bilaterally. Skin: Warm and dry. Neuro: No focal deficits. Psych: Normal affect. Responds appropriately.   Assessment:    No diagnosis found.  Plan:     Disposition: Follow up in ***   Signed, Corrin Parker, PA-C  05/10/2023 7:36 PM    [facility]

## 2023-05-13 ENCOUNTER — Ambulatory Visit: Payer: Managed Care, Other (non HMO) | Attending: Student | Admitting: Student

## 2023-05-19 ENCOUNTER — Encounter: Payer: Self-pay | Admitting: Nurse Practitioner

## 2023-08-07 ENCOUNTER — Ambulatory Visit: Payer: Managed Care, Other (non HMO) | Admitting: Nurse Practitioner

## 2023-08-10 ENCOUNTER — Other Ambulatory Visit: Payer: Self-pay | Admitting: Nurse Practitioner

## 2023-08-10 DIAGNOSIS — M5416 Radiculopathy, lumbar region: Secondary | ICD-10-CM

## 2023-08-12 NOTE — Telephone Encounter (Signed)
 Requested medication (s) are due for refill today: Yes  Requested medication (s) are on the active medication list: Yes  Last refill:  05/07/23  Future visit scheduled: No  Notes to clinic:  Unable to refill per protocol, cannot delegate.      Requested Prescriptions  Pending Prescriptions Disp Refills   pregabalin  (LYRICA ) 75 MG capsule [Pharmacy Med Name: PREGABALIN  75MG  CAPSULES] 60 capsule     Sig: TAKE 1 CAPSULE(75 MG) BY MOUTH TWICE DAILY     Not Delegated - Neurology:  Anticonvulsants - Controlled - pregabalin  Failed - 08/12/2023 10:35 AM      Failed - This refill cannot be delegated      Passed - Cr in normal range and within 360 days    Creatinine, Ser  Date Value Ref Range Status  05/07/2023 1.16 0.76 - 1.27 mg/dL Final         Passed - Completed PHQ-2 or PHQ-9 in the last 360 days      Passed - Valid encounter within last 12 months    Recent Outpatient Visits           3 months ago Insulin  dependent type 2 diabetes mellitus (HCC)   [facility] [place] Collins Dean, NP   1 year ago Type 2 diabetes mellitus with hyperlipidemia [facility])   [facility] [place]. [facility] Vernell Goldsmith, MD   1 year ago Type 2 diabetes mellitus with hyperlipidemia [facility])   [facility] - [facility] [place]. [facility] Collins Dean, NP   1 year ago Type 2 diabetes mellitus with hyperlipidemia [facility])   [facility] - [facility] Of [place]. [facility] Vernell Goldsmith, MD   2 years ago Type 2 diabetes mellitus with hyperlipidemia [facility])   [facility] - [facility] Dept Of [place] Vernell Goldsmith, MD
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
## 2023-10-17 ENCOUNTER — Ambulatory Visit: Admitting: Critical Care Medicine

## 2024-02-20 ENCOUNTER — Ambulatory Visit: Admitting: Podiatry

## 2024-02-24 ENCOUNTER — Ambulatory Visit (INDEPENDENT_AMBULATORY_CARE_PROVIDER_SITE_OTHER): Admitting: Podiatry

## 2024-02-24 DIAGNOSIS — B351 Tinea unguium: Secondary | ICD-10-CM | POA: Diagnosis not present

## 2024-02-24 DIAGNOSIS — M79675 Pain in left toe(s): Secondary | ICD-10-CM

## 2024-02-24 DIAGNOSIS — M79674 Pain in right toe(s): Secondary | ICD-10-CM

## 2024-02-24 DIAGNOSIS — E1149 Type 2 diabetes mellitus with other diabetic neurological complication: Secondary | ICD-10-CM | POA: Diagnosis not present

## 2024-02-24 MED ORDER — PREGABALIN 75 MG PO CAPS: 75.0000 mg | ORAL_CAPSULE | Freq: Three times a day (TID) | ORAL | 2 refills | Status: AC

## 2024-02-24 NOTE — Progress Notes (Unsigned)
94

## 2024-02-24 NOTE — Patient Instructions (Signed)
 Pregabalin  Capsules What is this medication? PREGABALIN  (pre GAB a lin) treats nerve pain. It may also be used to prevent and control seizures in people with epilepsy. It works by calming overactive nerves in your body. This medicine may be used for other purposes; ask your health care provider or pharmacist if you have questions. COMMON BRAND NAME(S): Lyrica  What should I tell my care team before I take this medication? They need to know if you have any of these conditions: Have or have had depression Heart failure Kidney disease Lung or breathing disease, such as asthma or COPD Substance use disorder Suicidal thoughts, plans, or attempt An unusual or allergic reaction to pregabalin , other medications, foods, dyes, or preservatives Pregnant or trying to get pregnant Breastfeeding How should I use this medication? Take this medication by mouth with water. Take it as directed on the prescription label at the same time every day. You can take it with or without food. If it upsets your stomach, take it with food. Keep taking it unless your care team tells you to stop. A special MedGuide will be given to you by the pharmacist with each prescription and refill. Be sure to read this information carefully each time. Talk to your care team about the use of this medication in children. While it may be prescribed for children as young as 1 month for selected conditions, precautions do apply. Overdosage: If you think you have taken too much of this medicine contact a poison control center or emergency room at once. NOTE: This medicine is only for you. Do not share this medicine with others. What if I miss a dose? If you miss a dose, take it as soon as you can. If it is almost time for your next dose, take only that dose. Do not take double or extra doses. What may interact with this medication? This medication may interact with the following: Alcohol Antihistamines for allergy, cough, and  cold Certain medications for anxiety or sleep Certain medications for blood pressure, heart disease Certain medications for depression like amitriptyline, fluoxetine, sertraline  Certain medications for diabetes, like pioglitazone, rosiglitazone Certain medications for seizures like phenobarbital, primidone General anesthetics like halothane, isoflurane, methoxyflurane, propofol  Medications that relax muscles for surgery Opioid medications for pain Phenothiazines like chlorpromazine, mesoridazine, prochlorperazine, thioridazine This list may not describe all possible interactions. Give your health care provider a list of all the medicines, herbs, non-prescription drugs, or dietary supplements you use. Also tell them if you smoke, drink alcohol, or use illegal drugs. Some items may interact with your medicine. What should I watch for while using this medication? Visit your care team for regular checks on your progress. Tell your care team if your symptoms do not start to get better or if they get worse. Do not suddenly stop taking this medication. You may develop a severe reaction. Your care team will tell you how much medication to take. If your care team wants you to stop the medication, the dose may be slowly lowered over time to avoid any side effects. This medication may affect your coordination, reaction time, or judgment. Do not drive or operate machinery until you know how this medication affects you. Sit up or stand slowly to reduce the risk of dizzy or fainting spells. Drinking alcohol with this medication can increase the risk of these side effects. Taking this medication with other CNS depressants can make you too sleepy. This can make it hard to breathe and stay awake. In some cases, it  can cause coma and death. CNS depressants include opioids, benzodiazepines, muscle relaxants, medications for sleep, and alcohol. Talk to your care team about all the medications, vitamins, and supplements  you take. They can tell you what is safe to take together. Call emergency services right away if you have slow or shallow breathing, feel dizzy or confused, or have trouble staying awake. This medication can cause new or worsening depression and increase the risk of suicidal thoughts and actions in a small number of people. This can happen while you are taking this medication or after stopping it. Talk to your care team right away if you have changes in mood or behavior or thoughts of self-harm or suicide. They can help you. If you take this medication for seizures, wear a medical ID bracelet or chain. Carry a card that describes your condition. List the medications and doses you take on the card. Talk to your care team if you may be pregnant. There are benefits and risks to taking medications during pregnancy. Your care team can help you find the option that works for you. What side effects may I notice from receiving this medication? Side effects that you should report to your care team as soon as possible: Allergic reactions or angioedema--skin rash, itching or hives, swelling of the face, eyes, lips, tongue, arms, or legs, trouble swallowing or breathing Change in vision Muscle injury--unusual weakness or fatigue, muscle pain, dark yellow or brown urine, decrease in amount of urine Thoughts of suicide or self-harm, worsening mood, feelings of depression Trouble breathing Unusual bruising or bleeding Unusual changes in mood or behavior in children after use such as trouble concentrating, hostility, or restlessness Side effects that usually do not require medical attention (report these to your care team if they continue or are bothersome): Blurry vision Dizziness Drowsiness Dry mouth Swelling of the ankles, hands, or feet Weight gain This list may not describe all possible side effects. Call your doctor for medical advice about side effects. You may report side effects to FDA at  1-800-FDA-1088. Where should I keep my medication? Keep out of the reach of children and pets. This medication can be abused. Keep it in a safe place to protect it from theft. Do not share it with anyone. It is only for you. Selling or giving away this medication is dangerous and against the law. Store at [employer] [address] (77 degrees F). Get rid of any unused medication after the expiration date. This medication may cause harm and death if it is taken by other adults, children, or pets. It is important to get rid of the medication as soon as you no longer need it, or it is expired. You can do this in two ways: Take the medication to a medication take-back program. Check with your pharmacy or law enforcement to find a location. If you cannot return the medication, check the label or package insert to see if the medication should be thrown out in the garbage or flushed down the toilet. If you are not sure, ask your care team. If it is safe to put it in the trash, take the medication out of the container. Mix the medication with cat litter, dirt, coffee grounds, or other unwanted substance. Seal the mixture in a bag or container. Put it in the trash. NOTE: This sheet is a summary. It may not cover all possible information. If you have questions about this medicine, talk to your doctor, pharmacist, or health care provider.  2025 Elsevier/Gold  Standard (2023-11-04 00:00:00)  Diabetes Mellitus and Foot Care Diabetes, also called diabetes mellitus, may cause problems with your feet and legs because of poor blood flow (circulation). Poor circulation may make your skin: Become thinner and drier. Break more easily. Heal more slowly. Peel and crack. You may also have nerve damage (neuropathy). This can cause decreased feeling in your legs and feet. This means that you may not notice minor injuries to your feet that could lead to more serious problems. Finding and treating problems early is the best way to prevent  future foot problems. How to care for your feet Foot hygiene  Wash your feet daily with warm water and mild soap. Do not use hot water. Then, pat your feet and the areas between your toes until they are fully dry. Do not soak your feet. This can dry your skin. Trim your toenails straight across. Do not dig under them or around the cuticle. File the edges of your nails with an emery board or nail file. Apply a moisturizing lotion or petroleum jelly to the skin on your feet and to dry, brittle toenails. Use lotion that does not contain alcohol and is unscented. Do not apply lotion between your toes. Shoes and socks Wear clean socks or stockings every day. Make sure they are not too tight. Do not wear knee-high stockings. These may decrease blood flow to your legs. Wear shoes that fit well and have enough cushioning. Always look in your shoes before you put them on to be sure there are no objects inside. To break in new shoes, wear them for just a few hours a day. This prevents injuries on your feet. Wounds, scrapes, corns, and calluses  Check your feet daily for blisters, cuts, bruises, sores, and redness. If you cannot see the bottom of your feet, use a mirror or ask someone for help. Do not cut off corns or calluses or try to remove them with medicine. If you find a minor scrape, cut, or break in the skin on your feet, keep it and the skin around it clean and dry. You may clean these areas with mild soap and water. Do not clean the area with peroxide, alcohol, or iodine. If you have a wound, scrape, corn, or callus on your foot, look at it several times a day to make sure it is healing and not infected. Check for: Redness, swelling, or pain. Fluid or blood. Warmth. Pus or a bad smell. General tips Do not cross your legs. This may decrease blood flow to your feet. Do not use heating pads or hot water bottles on your feet. They may burn your skin. If you have lost feeling in your feet or legs,  you may not know this is happening until it is too late. Protect your feet from hot and cold by wearing shoes, such as at the beach or on hot pavement. Schedule a complete foot exam at least once a year or more often if you have foot problems. Report any cuts, sores, or bruises to your health care provider right away. Where to find more information American Diabetes Association: diabetes.org Association of Diabetes Care & Education Specialists: diabeteseducator.org Contact a health care provider if: You have a condition that increases your risk of infection, and you have any cuts, sores, or bruises on your feet. You have an injury that is not healing. You have redness on your legs or feet. You feel burning or tingling in your legs or feet. You have pain  or cramps in your legs and feet. Your legs or feet are numb. Your feet always feel cold. You have pain around any toenails. Get help right away if: You have a wound, scrape, corn, or callus on your foot and: You have signs of infection. You have a fever. You have a red line going up your leg. This information is not intended to replace advice given to you by your health care provider. Make sure you discuss any questions you have with your health care provider. Document Revised: 12/20/2021 Document Reviewed: 12/20/2021 Elsevier Patient Education  2024 [employer].

## 2024-05-18 ENCOUNTER — Ambulatory Visit (INDEPENDENT_AMBULATORY_CARE_PROVIDER_SITE_OTHER): Admitting: Podiatry

## 2024-05-18 VITALS — Ht 69.0 in | Wt 211.8 lb

## 2024-05-18 DIAGNOSIS — M79674 Pain in right toe(s): Secondary | ICD-10-CM | POA: Diagnosis not present

## 2024-05-18 DIAGNOSIS — M722 Plantar fascial fibromatosis: Secondary | ICD-10-CM

## 2024-05-18 DIAGNOSIS — E1149 Type 2 diabetes mellitus with other diabetic neurological complication: Secondary | ICD-10-CM

## 2024-05-18 DIAGNOSIS — B351 Tinea unguium: Secondary | ICD-10-CM | POA: Diagnosis not present

## 2024-05-18 DIAGNOSIS — M79675 Pain in left toe(s): Secondary | ICD-10-CM | POA: Diagnosis not present

## 2024-05-18 DIAGNOSIS — I739 Peripheral vascular disease, unspecified: Secondary | ICD-10-CM

## 2024-05-18 DIAGNOSIS — M792 Neuralgia and neuritis, unspecified: Secondary | ICD-10-CM
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
## 2024-05-18 NOTE — Patient Instructions (Addendum)
For inserts I like POWERSTEPS, SUPERFEET, [employer]  Plantar Fasciitis (Heel Spur Syndrome) with Rehab The plantar fascia is a fibrous, ligament-like, soft-tissue structure that spans the bottom of the foot. Plantar fasciitis is a condition that causes pain in the foot due to inflammation of the tissue. SYMPTOMS  Pain and tenderness on the underneath side of the foot. Pain that worsens with standing or walking. CAUSES  Plantar fasciitis is caused by irritation and injury to the plantar fascia on the underneath side of the foot. Common mechanisms of injury include: Direct trauma to bottom of the foot. Damage to a small nerve that runs under the foot where the main fascia attaches to the heel bone. Stress placed on the plantar fascia due to bone spurs. RISK INCREASES WITH:  Activities that place stress on the plantar fascia (running, jumping, pivoting, or cutting). Poor strength and flexibility. Improperly fitted shoes. Tight calf muscles. Flat feet. Failure to warm-up properly before activity. Obesity. PREVENTION Warm up and stretch properly before activity. Allow for adequate recovery between workouts. Maintain physical fitness: Strength, flexibility, and endurance. Cardiovascular fitness. Maintain a health body weight. Avoid stress on the plantar fascia. Wear properly fitted shoes, including arch supports for individuals who have flat feet.  PROGNOSIS  If treated properly, then the symptoms of plantar fasciitis usually resolve without surgery. However, occasionally surgery is necessary.  RELATED COMPLICATIONS  Recurrent symptoms that may result in a chronic condition. Problems of the lower back that are caused by compensating for the injury, such as limping. Pain or weakness of the foot during push-off following surgery. Chronic inflammation, scarring, and partial or complete fascia tear, occurring more often from repeated injections.  TREATMENT  Treatment initially involves  the use of ice and medication to help reduce pain and inflammation. The use of strengthening and stretching exercises may help reduce pain with activity, especially stretches of the Achilles tendon. These exercises may be performed at home or with a therapist. Your caregiver may recommend that you use heel cups of arch supports to help reduce stress on the plantar fascia. Occasionally, corticosteroid injections are given to reduce inflammation. If symptoms persist for greater than 6 months despite non-surgical (conservative), then surgery may be recommended.   MEDICATION  If pain medication is necessary, then nonsteroidal anti-inflammatory medications, such as aspirin and ibuprofen, or other minor pain relievers, such as acetaminophen, are often recommended. Do not take pain medication within 7 days before surgery. Prescription pain relievers may be given if deemed necessary by your caregiver. Use only as directed and only as much as you need. Corticosteroid injections may be given by your caregiver. These injections should be reserved for the most serious cases, because they may only be given a certain number of times.  HEAT AND COLD Cold treatment (icing) relieves pain and reduces inflammation. Cold treatment should be applied for 10 to 15 minutes every 2 to 3 hours for inflammation and pain and immediately after any activity that aggravates your symptoms. Use ice packs or massage the area with a piece of ice (ice massage). Heat treatment may be used prior to performing the stretching and strengthening activities prescribed by your caregiver, physical therapist, or athletic trainer. Use a heat pack or soak the injury in warm water.  SEEK IMMEDIATE MEDICAL CARE IF: Treatment seems to offer no benefit, or the condition worsens. Any medications produce adverse side effects.  EXERCISES- RANGE OF MOTION (ROM) AND STRETCHING EXERCISES - Plantar Fasciitis (Heel Spur Syndrome) These exercises may help  you when beginning to rehabilitate your injury. Your symptoms may resolve with or without further involvement from your physician, physical therapist or athletic trainer. While completing these exercises, remember:  Restoring tissue flexibility helps normal motion to return to the joints. This allows healthier, less painful movement and activity. An effective stretch should be held for at least 30 seconds. A stretch should never be painful. You should only feel a gentle lengthening or release in the stretched tissue.  RANGE OF MOTION - Toe Extension, Flexion Sit with your right / left leg crossed over your opposite knee. Grasp your toes and gently pull them back toward the top of your foot. You should feel a stretch on the bottom of your toes and/or foot. Hold this stretch for 10 seconds. Now, gently pull your toes toward the bottom of your foot. You should feel a stretch on the top of your toes and or foot. Hold this stretch for 10 seconds. Repeat  times. Complete this stretch 3 times per day.   RANGE OF MOTION - Ankle Dorsiflexion, Active Assisted Remove shoes and sit on a chair that is preferably not on a carpeted surface. Place right / left foot under knee. Extend your opposite leg for support. Keeping your heel down, slide your right / left foot back toward the chair until you feel a stretch at your ankle or calf. If you do not feel a stretch, slide your bottom forward to the edge of the chair, while still keeping your heel down. Hold this stretch for 10 seconds. Repeat 3 times. Complete this stretch 2 times per day.   STRETCH  Gastroc, Standing Place hands on wall. Extend right / left leg, keeping the front knee somewhat bent. Slightly point your toes inward on your back foot. Keeping your right / left heel on the floor and your knee straight, shift your weight toward the wall, not allowing your back to arch. You should feel a gentle stretch in the right / left calf. Hold this position  for 10 seconds. Repeat 3 times. Complete this stretch 2 times per day.  STRETCH  Soleus, Standing Place hands on wall. Extend right / left leg, keeping the other knee somewhat bent. Slightly point your toes inward on your back foot. Keep your right / left heel on the floor, bend your back knee, and slightly shift your weight over the back leg so that you feel a gentle stretch deep in your back calf. Hold this position for 10 seconds. Repeat 3 times. Complete this stretch 2 times per day.  STRETCH  Gastrocsoleus, Standing  Note: This exercise can place a lot of stress on your foot and ankle. Please complete this exercise only if specifically instructed by your caregiver.  Place the ball of your right / left foot on a step, keeping your other foot firmly on the same step. Hold on to the wall or a rail for balance. Slowly lift your other foot, allowing your body weight to press your heel down over the edge of the step. You should feel a stretch in your right / left calf. Hold this position for 10 seconds. Repeat this exercise with a slight bend in your right / left knee. Repeat 3 times. Complete this stretch 2 times per day.   STRENGTHENING EXERCISES - Plantar Fasciitis (Heel Spur Syndrome)  These exercises may help you when beginning to rehabilitate your injury. They may resolve your symptoms with or without further involvement from your physician, physical therapist or athletic trainer.  While completing these exercises, remember:  Muscles can gain both the endurance and the strength needed for everyday activities through controlled exercises. Complete these exercises as instructed by your physician, physical therapist or athletic trainer. Progress the resistance and repetitions only as guided.  STRENGTH - Towel Curls Sit in a chair positioned on a non-carpeted surface. Place your foot on a towel, keeping your heel on the floor. Pull the towel toward your heel by only curling your toes.  Keep your heel on the floor. Repeat 3 times. Complete this exercise 2 times per day.  STRENGTH - Ankle Inversion Secure one end of a rubber exercise band/tubing to a fixed object (table, pole). Loop the other end around your foot just before your toes. Place your fists between your knees. This will focus your strengthening at your ankle. Slowly, pull your big toe up and in, making sure the band/tubing is positioned to resist the entire motion. Hold this position for 10 seconds. Have your muscles resist the band/tubing as it slowly pulls your foot back to the starting position. Repeat 3 times. Complete this exercises 2 times per day.  Document Released: 06/18/2005 Document Revised: 09/10/2011 Document Reviewed: 09/30/2008 [facility] Patient Information 2014 [place], [place].

## 2024-06-08 ENCOUNTER — Encounter (HOSPITAL_COMMUNITY): Payer: Self-pay

## 2024-08-17 ENCOUNTER — Ambulatory Visit: Admitting: Podiatry
